# Patient Record
Sex: Male | Born: 1980 | Race: White | Hispanic: No | Marital: Married | Smoking: Never smoker
Health system: Southern US, Community
[De-identification: ages and names within clinical notes are randomized; demographics above are authoritative.]

## PROBLEM LIST (undated history)

## (undated) DIAGNOSIS — R51 Headache: Secondary | ICD-10-CM

## (undated) DIAGNOSIS — R519 Headache, unspecified: Secondary | ICD-10-CM

---

## 2018-08-11 ENCOUNTER — Emergency Department (HOSPITAL_BASED_OUTPATIENT_CLINIC_OR_DEPARTMENT_OTHER): Payer: No Typology Code available for payment source

## 2018-08-11 ENCOUNTER — Emergency Department (HOSPITAL_BASED_OUTPATIENT_CLINIC_OR_DEPARTMENT_OTHER)
Admission: EM | Admit: 2018-08-11 | Discharge: 2018-08-11 | Disposition: A | Discharge status: Home / Self Care | Payer: No Typology Code available for payment source | Attending: Emergency Medicine | Admitting: Emergency Medicine

## 2018-08-11 ENCOUNTER — Other Ambulatory Visit: Payer: Self-pay

## 2018-08-11 ENCOUNTER — Encounter (HOSPITAL_BASED_OUTPATIENT_CLINIC_OR_DEPARTMENT_OTHER): Payer: Self-pay

## 2018-08-11 DIAGNOSIS — J181 Lobar pneumonia, unspecified organism: Secondary | ICD-10-CM

## 2018-08-11 DIAGNOSIS — J189 Pneumonia, unspecified organism: Secondary | ICD-10-CM | POA: Diagnosis not present

## 2018-08-11 DIAGNOSIS — R05 Cough: Secondary | ICD-10-CM | POA: Diagnosis present

## 2018-08-11 LAB — CBC WITH DIFFERENTIAL/PLATELET
BASOS ABS: 0 10*3/uL (ref 0.0–0.1)
Basophils Relative: 0 %
Eosinophils Absolute: 0.2 10*3/uL (ref 0.0–0.7)
Eosinophils Relative: 3 %
HEMATOCRIT: 45.3 % (ref 39.0–52.0)
Hemoglobin: 15.6 g/dL (ref 13.0–17.0)
LYMPHS ABS: 1.2 10*3/uL (ref 0.7–4.0)
Lymphocytes Relative: 16 %
MCH: 30.4 pg (ref 26.0–34.0)
MCHC: 34.4 g/dL (ref 30.0–36.0)
MCV: 88.1 fL (ref 78.0–100.0)
MONO ABS: 0.8 10*3/uL (ref 0.1–1.0)
MONOS PCT: 10 %
NEUTROS ABS: 5.4 10*3/uL (ref 1.7–7.7)
Neutrophils Relative %: 71 %
PLATELETS: 278 10*3/uL (ref 150–400)
RBC: 5.14 MIL/uL (ref 4.22–5.81)
RDW: 13.9 % (ref 11.5–15.5)
WBC: 7.6 10*3/uL (ref 4.0–10.5)

## 2018-08-11 LAB — BASIC METABOLIC PANEL
Anion gap: 13 (ref 5–15)
BUN: 11 mg/dL (ref 6–20)
CALCIUM: 8.6 mg/dL — AB (ref 8.9–10.3)
CO2: 25 mmol/L (ref 22–32)
CREATININE: 1.14 mg/dL (ref 0.61–1.24)
Chloride: 102 mmol/L (ref 98–111)
GFR calc Af Amer: >60 mL/min (ref >60)
GLUCOSE: 105 mg/dL — AB (ref 70–99)
POTASSIUM: 3.4 mmol/L — AB (ref 3.5–5.1)
SODIUM: 140 mmol/L (ref 135–145)

## 2018-08-11 MED ORDER — HYDROCODONE-ACETAMINOPHEN 5-325 MG PO TABS
2.0000 | ORAL_TABLET | Freq: Once | ORAL | Status: AC
  Administered 2018-08-11: 2 via ORAL
  Filled 2018-08-11: qty 2

## 2018-08-11 MED ORDER — KETOROLAC TROMETHAMINE 60 MG/2ML IM SOLN
INTRAMUSCULAR | Status: AC
  Filled 2018-08-11: qty 2

## 2018-08-11 MED ORDER — IPRATROPIUM-ALBUTEROL 0.5-2.5 (3) MG/3ML IN SOLN
3.0000 mL | Freq: Four times a day (QID) | RESPIRATORY_TRACT | Status: DC
  Administered 2018-08-11: 3 mL via RESPIRATORY_TRACT
  Filled 2018-08-11: qty 3

## 2018-08-11 MED ORDER — SODIUM CHLORIDE 0.9 % IV SOLN
1.0000 g | Freq: Once | INTRAVENOUS | Status: AC
  Administered 2018-08-11: 1 g via INTRAVENOUS
  Filled 2018-08-11: qty 10

## 2018-08-11 MED ORDER — SODIUM CHLORIDE 0.9 % IV SOLN
INTRAVENOUS | Status: DC | PRN
  Administered 2018-08-11 (×2): 1000 mL via INTRAVENOUS

## 2018-08-11 MED ORDER — CEFPODOXIME PROXETIL 200 MG PO TABS: 200.0000 mg | ORAL_TABLET | Freq: Two times a day (BID) | ORAL | 0 refills | Status: DC

## 2018-08-11 MED ORDER — KETOROLAC TROMETHAMINE 30 MG/ML IJ SOLN
30.0000 mg | Freq: Once | INTRAMUSCULAR | Status: AC
  Administered 2018-08-11: 30 mg via INTRAVENOUS

## 2018-08-11 MED ORDER — SODIUM CHLORIDE 0.9 % IV BOLUS
1000.0000 mL | Freq: Once | INTRAVENOUS | Status: AC
  Administered 2018-08-11: 1000 mL via INTRAVENOUS

## 2018-08-11 MED ORDER — HYDROCODONE-ACETAMINOPHEN 5-325 MG PO TABS: 1.0000 | ORAL_TABLET | ORAL | 0 refills | Status: DC | PRN

## 2018-08-11 NOTE — ED Notes (Signed)
Pt verbalizes understanding of d/c instructions and denies any further needs at this time. 

## 2018-08-11 NOTE — Discharge Instructions (Signed)
You were evaluated in the emergency department for worsening cough and fever.  You had blood work and chest x-ray and you were found to have a left lower lobe pneumonia.  We are adding a second antibiotic and you should continue to finish out the first antibiotic.  We are also prescribing his pain medicine to take.  You should drink plenty of fluids and continue to take ibuprofen.  Follow-up with your doctor and return if any worsening symptoms.

## 2018-08-11 NOTE — ED Triage Notes (Signed)
C/o cough x 12 days-was seen [facility]-unknown CXR results-was started on zpack and is still taking abx-NAD-steady gait

## 2018-08-11 NOTE — ED Notes (Signed)
Patient transported to X-ray 

## 2018-08-11 NOTE — ED Provider Notes (Signed)
[facility] EMERGENCY DEPARTMENT Provider Note   CSN: [id number] Arrival date & time: 08/11/18  1930     History   Chief Complaint Chief Complaint  Patient presents with  . Cough    HPI Bryan Adams is a 37 y.o. male.  His daughter was sick a couple weeks ago with possible pneumonia and improved on amoxicillin.  He started with respiratory symptoms about 12 days ago and has had cough minimally productive up to the point that he is even vomiting from coughing so hard.  He has had daily headaches with this.  He went to the [place] [facility] where they did a chest x-ray and some blood work and prescribed him Zithromax and an inhaler and some guaifenesin.  He has been using his medications for 2 days with no improvement.  He still feels hot and cold.  Fatigue.  He does not smoke or use drugs and does not have any occupational exposures that he considers significant.  The history is provided by the patient.  Cough  This is a new problem. The current episode started more than 1 week ago. The problem occurs constantly. The problem has not changed since onset.The cough is productive of sputum. Associated symptoms include headaches. Pertinent negatives include no chest pain, no sore throat and no shortness of breath. He has tried cough syrup for the symptoms. The treatment provided no relief. He is not a smoker.    History reviewed. No pertinent past medical history.  There are no active problems to display for this patient.   History reviewed. No pertinent surgical history.      Home Medications    Prior to Admission medications   Not on File    Family History No family history on file.  Social History Social History   Tobacco Use  . Smoking status: Never Smoker  . Smokeless tobacco: Never Used  Substance Use Topics  . Alcohol use: Never    Frequency: Never  . Drug use: Never     Allergies   Patient has no known allergies.   Review of Systems Review of  Systems  Constitutional: Negative for fever.  HENT: Negative for sore throat.   Eyes: Negative for visual disturbance.  Respiratory: Positive for cough. Negative for shortness of breath.   Cardiovascular: Negative for chest pain.  Gastrointestinal: Negative for abdominal pain.  Genitourinary: Negative for dysuria.  Musculoskeletal: Negative for neck pain.  Skin: Negative for rash.  Neurological: Positive for headaches.     Physical Exam Updated Vital Signs BP (!) 166/96 (BP Location: Left Arm)   Pulse 88   Temp 98.6 F (37 C) (Oral)   Resp 20   Ht 5\' 7"  (1.702 m)   Wt 91.2 kg   SpO2 98%   BMI 31.48 kg/m   Physical Exam  Constitutional: He appears well-developed and well-nourished.  HENT:  Head: Normocephalic and atraumatic.  Right Ear: External ear normal.  Left Ear: External ear normal.  Mouth/Throat: Oropharynx is clear and moist.  Eyes: Conjunctivae are normal.  Neck: Neck supple.  Cardiovascular: Normal rate, regular rhythm and normal heart sounds.  No murmur heard. Pulmonary/Chest: Effort normal and breath sounds normal. No respiratory distress.  Abdominal: Soft. There is no tenderness.  Musculoskeletal: He exhibits no edema or deformity.  Neurological: He is alert.  Skin: Skin is warm and dry. Capillary refill takes less than 2 seconds.  Psychiatric: He has a normal mood and affect.  Nursing note and vitals reviewed.  ED Treatments / Results  Labs (all labs ordered are listed, but only abnormal results are displayed) Labs Reviewed  BASIC METABOLIC PANEL - Abnormal; Notable for the following components:      Result Value   Potassium 3.4 (*)    Glucose, Bld 105 (*)    Calcium 8.6 (*)    All other components within normal limits  CBC WITH DIFFERENTIAL/PLATELET    EKG None  Radiology Dg Chest 2 View  Result Date: 08/11/2018 CLINICAL DATA:  Cough EXAM: CHEST - 2 VIEW COMPARISON:  None. FINDINGS: Consolidation in the posterior basal segment of the  left lower lobe. Normal heart size. Lungs otherwise clear. No pneumothorax. No pleural effusion. IMPRESSION: Left lower lobe pneumonia. Followup PA and lateral chest X-ray is recommended in 3-4 weeks following trial of antibiotic therapy to ensure resolution and exclude underlying malignancy. Electronically Signed   By: Jolaine Click M.D.   On: 08/11/2018 20:15    Procedures Procedures (including critical care time)  Medications Ordered in ED Medications  ipratropium-albuterol (DUONEB) 0.5-2.5 (3) MG/3ML nebulizer solution 3 mL (3 mLs Nebulization Given 08/11/18 2003)  ketorolac (TORADOL) 60 MG/2ML injection (has no administration in time range)  0.9 %  sodium chloride infusion ( Intravenous Stopped 08/11/18 2154)  sodium chloride 0.9 % bolus 1,000 mL (0 mLs Intravenous Stopped 08/11/18 2129)  cefTRIAXone (ROCEPHIN) 1 g in sodium chloride 0.9 % 100 mL IVPB (0 g Intravenous Stopped 08/11/18 2154)  ketorolac (TORADOL) 30 MG/ML injection 30 mg (30 mg Intravenous Given 08/11/18 2049)  HYDROcodone-acetaminophen (NORCO/VICODIN) 5-325 MG per tablet 2 tablet (2 tablets Oral Given 08/11/18 2142)     Initial Impression / Assessment and Plan / ED Course  I have reviewed the triage vital signs and the nursing notes.  Pertinent labs & imaging results that were available during my care of the patient were reviewed by me and considered in my medical decision making (see chart for details).   Patient here with cough fevers chills and intermittent headache being treated for probable pneumonia with Zithromax.  He did not have any improvement after 2 days on the medicine.  I have gotten labs and a chest x-ray on him and he does indeed have a left lower lobe pneumonia.  We have given an IV dose of ceftriaxone and okay to add coverage to him with Vantin p.o.  He is asking for something to help with his cough and headache to do so prescribe him some pain medicine in the meantime.  He understands to return if any worsening  symptoms.   Final Clinical Impressions(s) / ED Diagnoses   Final diagnoses:  Community acquired pneumonia of left lower lobe of lung [facility])    ED Discharge Orders         Ordered    cefpodoxime (VANTIN) 200 MG tablet  2 times daily     08/11/18 2123    HYDROcodone-acetaminophen (NORCO/VICODIN) 5-325 MG tablet  Every 4 hours PRN     08/11/18 2123           Terrilee Files, MD 08/11/18 2333

## 2018-08-12 ENCOUNTER — Other Ambulatory Visit: Payer: Self-pay

## 2018-08-12 ENCOUNTER — Observation Stay (HOSPITAL_COMMUNITY)
Admission: EM | Admit: 2018-08-12 | Discharge: 2018-08-14 | Disposition: A | Discharge status: Home / Self Care | Payer: No Typology Code available for payment source | Attending: Family Medicine | Admitting: Family Medicine

## 2018-08-12 ENCOUNTER — Encounter (HOSPITAL_COMMUNITY): Payer: Self-pay | Admitting: *Deleted

## 2018-08-12 DIAGNOSIS — A419 Sepsis, unspecified organism: Secondary | ICD-10-CM | POA: Diagnosis not present

## 2018-08-12 DIAGNOSIS — R509 Fever, unspecified: Secondary | ICD-10-CM | POA: Insufficient documentation

## 2018-08-12 DIAGNOSIS — R05 Cough: Secondary | ICD-10-CM | POA: Diagnosis present

## 2018-08-12 DIAGNOSIS — J181 Lobar pneumonia, unspecified organism: Principal | ICD-10-CM | POA: Diagnosis present

## 2018-08-12 DIAGNOSIS — G9341 Metabolic encephalopathy: Secondary | ICD-10-CM | POA: Diagnosis not present

## 2018-08-12 DIAGNOSIS — G43909 Migraine, unspecified, not intractable, without status migrainosus: Secondary | ICD-10-CM | POA: Diagnosis not present

## 2018-08-12 DIAGNOSIS — R410 Disorientation, unspecified: Secondary | ICD-10-CM | POA: Insufficient documentation

## 2018-08-12 DIAGNOSIS — J189 Pneumonia, unspecified organism: Secondary | ICD-10-CM

## 2018-08-12 DIAGNOSIS — R000 Tachycardia, unspecified: Secondary | ICD-10-CM | POA: Insufficient documentation

## 2018-08-12 HISTORY — DX: Headache, unspecified: R51.9

## 2018-08-12 HISTORY — DX: Headache: R51

## 2018-08-12 LAB — CBC WITH DIFFERENTIAL/PLATELET
BASOS ABS: 0 10*3/uL (ref 0.0–0.1)
Basophils Relative: 0 %
EOS ABS: 0.1 10*3/uL (ref 0.0–0.7)
EOS PCT: 1 %
HCT: 41.4 % (ref 39.0–52.0)
HEMOGLOBIN: 13.7 g/dL (ref 13.0–17.0)
Lymphocytes Relative: 14 %
Lymphs Abs: 1 10*3/uL (ref 0.7–4.0)
MCH: 29.4 pg (ref 26.0–34.0)
MCHC: 33.1 g/dL (ref 30.0–36.0)
MCV: 88.8 fL (ref 78.0–100.0)
Monocytes Absolute: 0.5 10*3/uL (ref 0.1–1.0)
Monocytes Relative: 6 %
NEUTROS PCT: 79 %
Neutro Abs: 5.6 10*3/uL (ref 1.7–7.7)
PLATELETS: 288 10*3/uL (ref 150–400)
RBC: 4.66 MIL/uL (ref 4.22–5.81)
RDW: 14.2 % (ref 11.5–15.5)
WBC: 7.2 10*3/uL (ref 4.0–10.5)

## 2018-08-12 LAB — BASIC METABOLIC PANEL
Anion gap: 11 (ref 5–15)
BUN: 14 mg/dL (ref 6–20)
CALCIUM: 8.5 mg/dL — AB (ref 8.9–10.3)
CO2: 25 mmol/L (ref 22–32)
CREATININE: 1.25 mg/dL — AB (ref 0.61–1.24)
Chloride: 103 mmol/L (ref 98–111)
GFR calc Af Amer: >60 mL/min (ref >60)
GLUCOSE: 96 mg/dL (ref 70–99)
Potassium: 3.6 mmol/L (ref 3.5–5.1)
Sodium: 139 mmol/L (ref 135–145)

## 2018-08-12 LAB — I-STAT CG4 LACTIC ACID, ED: LACTIC ACID, VENOUS: 0.81 mmol/L (ref 0.5–1.9)

## 2018-08-12 MED ORDER — SODIUM CHLORIDE 0.9 % IV SOLN
2.0000 g | Freq: Once | INTRAVENOUS | Status: AC
  Administered 2018-08-12: 2 g via INTRAVENOUS
  Filled 2018-08-12: qty 2

## 2018-08-12 MED ORDER — ACETAMINOPHEN 500 MG PO TABS
1000.0000 mg | ORAL_TABLET | Freq: Once | ORAL | Status: AC
  Administered 2018-08-12: 1000 mg via ORAL
  Filled 2018-08-12: qty 2

## 2018-08-12 MED ORDER — DIPHENHYDRAMINE HCL 50 MG/ML IJ SOLN
25.0000 mg | Freq: Once | INTRAMUSCULAR | Status: AC
  Administered 2018-08-12: 25 mg via INTRAVENOUS
  Filled 2018-08-12: qty 1

## 2018-08-12 MED ORDER — VANCOMYCIN HCL IN DEXTROSE 1-5 GM/200ML-% IV SOLN: 1000.0000 mg | Freq: Once | INTRAVENOUS | Status: DC

## 2018-08-12 MED ORDER — SODIUM CHLORIDE 0.9 % IV BOLUS: 1000.0000 mL | Freq: Once | INTRAVENOUS | Status: DC

## 2018-08-12 MED ORDER — SODIUM CHLORIDE 0.9 % IV BOLUS
1000.0000 mL | Freq: Once | INTRAVENOUS | Status: AC
  Administered 2018-08-12: 1000 mL via INTRAVENOUS

## 2018-08-12 MED ORDER — KETOROLAC TROMETHAMINE 30 MG/ML IJ SOLN
30.0000 mg | Freq: Once | INTRAMUSCULAR | Status: AC
  Administered 2018-08-12: 30 mg via INTRAVENOUS
  Filled 2018-08-12: qty 1

## 2018-08-12 MED ORDER — METOCLOPRAMIDE HCL 5 MG/ML IJ SOLN
10.0000 mg | Freq: Once | INTRAMUSCULAR | Status: AC
  Administered 2018-08-12: 10 mg via INTRAVENOUS
  Filled 2018-08-12: qty 2

## 2018-08-12 MED ORDER — IPRATROPIUM-ALBUTEROL 0.5-2.5 (3) MG/3ML IN SOLN
3.0000 mL | Freq: Once | RESPIRATORY_TRACT | Status: AC
  Administered 2018-08-12: 3 mL via RESPIRATORY_TRACT
  Filled 2018-08-12: qty 3

## 2018-08-12 MED ORDER — VANCOMYCIN HCL 10 G IV SOLR
2000.0000 mg | Freq: Once | INTRAVENOUS | Status: AC
  Administered 2018-08-12: 2000 mg via INTRAVENOUS
  Filled 2018-08-12: qty 2000

## 2018-08-12 MED ORDER — SODIUM CHLORIDE 0.9 % IV BOLUS
2000.0000 mL | Freq: Once | INTRAVENOUS | Status: AC
  Administered 2018-08-13: 2000 mL via INTRAVENOUS

## 2018-08-12 NOTE — H&P (Signed)
History and Physical    Diana EvesMichael A Nester ZOX:[id number] DOB: 03/23/1981 DOA: 08/12/2018  Referring MD/NP/PA:   PCP: Patient, No Pcp Per   Patient coming from:  The patient is coming from home.  At baseline, pt is independent for most of ADL.   Chief Complaint: cough, SOB, fever and chills, mild confusion  HPI: Diana EvesMichael A Madril is a 37 y.o. male with medical history significant of occasional headaches, who presents with cough, fever, chills, shortness of breath and mild confusion.  Patient states that he has been having cough, mild shortness of breath, fever and chills for more than 2 weeks.  He also has mild chest pain which is induced by coughing.  Mostly patient just has dry cough. Pt [facility].  Has not had any improvement despite taking azithro.  [facility] and had CXR that showed left lower lobe pneumonia.  he was given fluids and rocephin injection. He was discharged on Vantin. Per his wife, pt continues to have cough and mild shortness of breath.  He becomes lethargic and mildly confused. When saw pt in  ED, he is lethargic, but oriented x3.  He answers all questions appropriately.  Patient states that he has headache behind both eyes, no neck rigidity. No recent long distant traveling.  No tenderness in the calf areas.  Patient had loose stool yesterday, which has resolved.  Currently patient does not have nausea, vomiting, diarrhea or abdominal pain.  Denies symptoms of UTI.  No unilateral weakness.  Patient is strongly denies any fall or head injury recently.  Patient states that he is getting testosterone injection due to lower testosterone level, last dose was on Monday.   ED Course: pt was found to have WBC 7.2, lactic acid is 0.81, creatinine 1.25, GFR> 60, temperature 101.9, tachycardia, tachypnea, oxygen saturation 92 to 97% on room air.  Chest x-ray showed left lower lobe infiltration.  Patient is placed on telemetry  bed for observation.  Review of Systems:   General: has fevers, chills, no body weight gain, has fatigue HEENT: no blurry vision, hearing changes or sore throat Respiratory: has dyspnea, coughing, no wheezing CV: has chest pain, no palpitations GI: no nausea, vomiting, abdominal pain, diarrhea, constipation GU: no dysuria, burning on urination, increased urinary frequency, hematuria  Ext: no leg edema Neuro: no unilateral weakness, numbness, or tingling, no vision change or hearing loss. Has confusion. Skin: no rash, no skin tear. MSK: No muscle spasm, no deformity, no limitation of range of movement in spin Heme: No easy bruising.  Travel history: No recent long distant travel.  Allergy: No Known Allergies  Past Medical History:  Diagnosis Date  . Headache     History reviewed. No pertinent surgical history.  Social History:  reports that he has never smoked. He has never used smokeless tobacco. He reports that he does not drink alcohol or use drugs.  Family History:  Family History  Problem Relation Age of Onset  . Diabetes Mellitus II Father      Prior to Admission medications   Medication Sig Start Date End Date Taking? Authorizing Provider  cefpodoxime (VANTIN) 200 MG tablet Take 1 tablet (200 mg total) by mouth 2 (two) times daily. 08/11/18  Yes Terrilee FilesButler, Henrry C, MD  HYDROcodone-acetaminophen (NORCO/VICODIN) 5-325 MG tablet Take 1-2 tablets by mouth every 4 (four) hours as needed for severe pain. 08/11/18  Yes Terrilee FilesButler, Duron C, MD    Physical  Exam: Vitals:   08/12/18 2230 08/12/18 2245 08/12/18 2325 08/12/18 2342  BP: 134/75 134/67  127/68  Pulse: (!) 102 93  (!) 109  Resp: 19 19  (!) 22  Temp:   (!) 100.4 F (38 C)   TempSrc:      SpO2: 96% 97%  97%   General: Not in acute distress HEENT:       Eyes: PERRL, EOMI, no scleral icterus.       ENT: No discharge from the ears and nose, no pharynx injection, no tonsillar enlargement.        Neck: No JVD, no  bruit, no mass felt. Heme: No neck lymph node enlargement. Cardiac: S1/S2, RRR, No murmurs, No gallops or rubs. Respiratory: No rales, wheezing, rhonchi or rubs. GI: Soft, nondistended, nontender, no rebound pain, no organomegaly, BS present. GU: No hematuria Ext: No pitting leg edema bilaterally. 2+DP/PT pulse bilaterally. Musculoskeletal: No joint deformities, No joint redness or warmth, no limitation of ROM in spin. Skin: No rashes.  Neuro: lethargic, but oriented X3, cranial nerves II-XII grossly intact, moves all extremities normally. Neck is supple. Psych: Patient is not psychotic, no suicidal or hemocidal ideation.  Labs on Admission: I have personally reviewed following labs and imaging studies  CBC: Recent Labs  Lab 08/11/18 2002 08/12/18 2054  WBC 7.6 7.2  NEUTROABS 5.4 5.6  HGB 15.6 13.7  HCT 45.3 41.4  MCV 88.1 88.8  PLT 278 288   Basic Metabolic Panel: Recent Labs  Lab 08/11/18 2002 08/12/18 2054  NA 140 139  K 3.4* 3.6  CL 102 103  CO2 25 25  GLUCOSE 105* 96  BUN 11 14  CREATININE 1.14 1.25*  CALCIUM 8.6* 8.5*   GFR: Estimated Creatinine Clearance: 87.1 mL/min (A) (by C-G formula based on SCr of 1.25 mg/dL (H)). Liver Function Tests: No results for input(s): AST, ALT, ALKPHOS, BILITOT, PROT, ALBUMIN in the last 168 hours. No results for input(s): LIPASE, AMYLASE in the last 168 hours. No results for input(s): AMMONIA in the last 168 hours. Coagulation Profile: No results for input(s): INR, PROTIME in the last 168 hours. Cardiac Enzymes: No results for input(s): CKTOTAL, CKMB, CKMBINDEX, TROPONINI in the last 168 hours. BNP (last 3 results) No results for input(s): PROBNP in the last 8760 hours. HbA1C: No results for input(s): HGBA1C in the last 72 hours. CBG: No results for input(s): GLUCAP in the last 168 hours. Lipid Profile: No results for input(s): CHOL, HDL, LDLCALC, TRIG, CHOLHDL, LDLDIRECT in the last 72 hours. Thyroid Function Tests: No  results for input(s): TSH, T4TOTAL, FREET4, T3FREE, THYROIDAB in the last 72 hours. Anemia Panel: No results for input(s): VITAMINB12, FOLATE, FERRITIN, TIBC, IRON, RETICCTPCT in the last 72 hours. Urine analysis: No results found for: COLORURINE, APPEARANCEUR, LABSPEC, PHURINE, GLUCOSEU, HGBUR, BILIRUBINUR, KETONESUR, PROTEINUR, UROBILINOGEN, NITRITE, LEUKOCYTESUR Sepsis Labs: @LABRCNTIP (procalcitonin:4,lacticidven:4) )No results found for this or any previous visit (from the past 240 hour(s)).   Radiological Exams on Admission: Dg Chest 2 View  Result Date: 08/11/2018 CLINICAL DATA:  Cough EXAM: CHEST - 2 VIEW COMPARISON:  None. FINDINGS: Consolidation in the posterior basal segment of the left lower lobe. Normal heart size. Lungs otherwise clear. No pneumothorax. No pleural effusion. IMPRESSION: Left lower lobe pneumonia. Followup PA and lateral chest X-ray is recommended in 3-4 weeks following trial of antibiotic therapy to ensure resolution and exclude underlying malignancy. Electronically Signed   By: Jolaine Click M.D.   On: 08/11/2018 20:15     EKG: Independently  reviewed.  Sinus rhythm, QTC 427, nonspecific T wave change.   Assessment/Plan Principal Problem:   Lobar pneumonia (HCC) Active Problems:   Sepsis (HCC)   Acute metabolic encephalopathy   Sepsis due to lobar pneumonia [facility]): Patient meets criteria for sepsis with fever, tachycardia and tachypnea.  Lactic acid is normal.  Hemodynamically stable currently.  Patient does not have leukocytosis, indicating possible viral pneumonia.  Patient has confusion and headache, but neck is supple, no rigidity, low suspicions of meningitis.  Patient states that he has history of occasional headache.  He takes Excedrin for headache at home. Pt failed outpatient antibiotics treatment (azithromycin and Vantin), will broaden the antibiotic coverage.   - Will place on telemetry bed for obs. - IV Vancomycin and cefepime, doxycycline -  Mucinex for cough  - Atrovent nebs and Xopenex Neb prn for SOB - Urine legionella and S. pneumococcal antigen - Follow up blood culture x2, sputum culture and respiratory virus panel - will get Procalcitonin and trend lactic acid level per sepsis protocol - IVF: 3L of NS bolus in ED, followed by 125 mL per hour of NS   Acute metabolic encephalopathy: Likely due to sepsis.  No meningeal sign.  Strongly denies recent fall or head injury.  -Frequent neuro check -As needed Excedrin for headache -if no improvement, may need to consider to get CT head pneumonia    DVT ppx: SQ Lovenox Code Status: Full code Family Communication:     Yes, patient's wife at bed side Disposition Plan:  Anticipate discharge back to previous home environment Consults called:  none Admission status: Obs / tele     Date of Service 08/13/2018    Lorretta Harp [facility] Pager [phone]  If 7PM-7AM, please contact night-coverage [email or web address] Password [email or web address] 08/13/2018, 1:18 AM

## 2018-08-12 NOTE — Progress Notes (Signed)
A consult was received from an ED physician for Vancomycin & Cefepime per pharmacy dosing.  The patient's profile has been reviewed for ht/wt/allergies/indication/available labs.   A one time order has been placed for Vancomycin 2gm & Cefepime 2gm IV.  Further antibiotics/pharmacy consults should be ordered by admitting physician if indicated.                       Thank you, Elson ClanLilliston, Jamika Sadek Michelle 08/12/2018  8:47 PM

## 2018-08-12 NOTE — ED Provider Notes (Addendum)
[facility]-EMERGENCY DEPT Provider Note   CSN: [id number] Arrival date & time: 08/12/18  1927     History   Chief Complaint Chief Complaint  Patient presents with  . Pneumonia    HPI Bryan Adams is a 37 y.o. male.  Patient presents to the ED with a chief complaint of pneumonia.  Patient reports feeling sick for the past 2 weeks.  Was [facility] [place].  Has not had any improvement despite taking azithro.  [facility] and had CXR that showed left lower lobe pneumonia.  Was given fluids and rocephin and was discharged.  Was advised to come back to the ED if symptoms worsened.  Patient wife states that when she got home today he was confused and lethargic.  She brought him to the ED for re-evaluation.  Patient complains of severe cough with associated post-tussive emesis.  He reports associated headache, which he believes is from coughing so much.    The history is provided by the patient. No language interpreter was used.    History reviewed. No pertinent past medical history.  There are no active problems to display for this patient.   History reviewed. No pertinent surgical history.      Home Medications    Prior to Admission medications   Medication Sig Start Date End Date Taking? Authorizing Provider  cefpodoxime (VANTIN) 200 MG tablet Take 1 tablet (200 mg total) by mouth 2 (two) times daily. 08/11/18   Terrilee FilesButler, Calix C, MD  HYDROcodone-acetaminophen (NORCO/VICODIN) 5-325 MG tablet Take 1-2 tablets by mouth every 4 (four) hours as needed for severe pain. 08/11/18   Terrilee FilesButler, Anthonny C, MD    Family History No family history on file.  Social History Social History   Tobacco Use  . Smoking status: Never Smoker  . Smokeless tobacco: Never Used  Substance Use Topics  . Alcohol use: Never    Frequency: Never  . Drug use: Never     Allergies   Patient has no known allergies.   Review of  Systems Review of Systems  All other systems reviewed and are negative.    Physical Exam Updated Vital Signs BP (!) 150/79 (BP Location: Right Arm)   Pulse (!) 108   Temp (!) 101.9 F (38.8 C) (Oral)   Resp 17   SpO2 97%   Physical Exam  Constitutional: He is oriented to person, place, and time. He appears well-developed and well-nourished.  HENT:  Head: Normocephalic and atraumatic.  Eyes: Pupils are equal, round, and reactive to light. Conjunctivae and EOM are normal. Right eye exhibits no discharge. Left eye exhibits no discharge. No scleral icterus.  Neck: Normal range of motion. Neck supple. No JVD present.  Cardiovascular: Regular rhythm and normal heart sounds. Exam reveals no gallop and no friction rub.  No murmur heard. tachycardic  Pulmonary/Chest: Effort normal. No respiratory distress. He has no wheezes. He has no rales. He exhibits no tenderness.  Crackles in left lower lobe  Abdominal: Soft. He exhibits no distension and no mass. There is no tenderness. There is no rebound and no guarding.  Musculoskeletal: Normal range of motion. He exhibits no edema or tenderness.  Neurological: He is alert and oriented to person, place, and time.  Skin: Skin is warm and dry.  Psychiatric: He has a normal mood and affect. His behavior is normal. Judgment and thought content normal.  Nursing note and vitals reviewed.  ED Treatments / Results  Labs (all labs ordered are listed, but only abnormal results are displayed) Labs Reviewed  CULTURE, BLOOD (ROUTINE X 2)  CULTURE, BLOOD (ROUTINE X 2)  CBC WITH DIFFERENTIAL/PLATELET  BASIC METABOLIC PANEL  I-STAT CG4 LACTIC ACID, ED  I-STAT CG4 LACTIC ACID, ED    EKG EKG Interpretation  Date/Time:  Thursday August 12 2018 21:00:21 EDT Ventricular Rate:  108 PR Interval:    QRS Duration: 90 QT Interval:  318 QTC Calculation: 427 R Axis:   89 Text Interpretation:  Sinus tachycardia Consider left ventricular hypertrophy  No old tracing to compare Confirmed by Linwood Dibbles [phone]) on 08/12/2018 9:03:58 PM   Radiology Dg Chest 2 View  Result Date: 08/11/2018 CLINICAL DATA:  Cough EXAM: CHEST - 2 VIEW COMPARISON:  None. FINDINGS: Consolidation in the posterior basal segment of the left lower lobe. Normal heart size. Lungs otherwise clear. No pneumothorax. No pleural effusion. IMPRESSION: Left lower lobe pneumonia. Followup PA and lateral chest X-ray is recommended in 3-4 weeks following trial of antibiotic therapy to ensure resolution and exclude underlying malignancy. Electronically Signed   By: Jolaine Click M.D.   On: 08/11/2018 20:15    Procedures Procedures (including critical care time)  Medications Ordered in ED Medications  ceFEPIme (MAXIPIME) 2 g in sodium chloride 0.9 % 100 mL IVPB (2 g Intravenous New Bag/Given 08/12/18 2320)  sodium chloride 0.9 % bolus 2,000 mL (has no administration in time range)  sodium chloride 0.9 % bolus 1,000 mL (0 mLs Intravenous Stopped 08/12/18 2313)  ipratropium-albuterol (DUONEB) 0.5-2.5 (3) MG/3ML nebulizer solution 3 mL (3 mLs Nebulization Given 08/12/18 2051)  vancomycin (VANCOCIN) 2,000 mg in sodium chloride 0.9 % 500 mL IVPB (0 mg Intravenous Stopped 08/12/18 2313)  ketorolac (TORADOL) 30 MG/ML injection 30 mg (30 mg Intravenous Given 08/12/18 2121)  metoCLOPramide (REGLAN) injection 10 mg (10 mg Intravenous Given 08/12/18 2121)  diphenhydrAMINE (BENADRYL) injection 25 mg (25 mg Intravenous Given 08/12/18 2121)  acetaminophen (TYLENOL) tablet 1,000 mg (1,000 mg Oral Given 08/12/18 2320)     Initial Impression / Assessment and Plan / ED Course  I have reviewed the triage vital signs and the nursing notes.  Pertinent labs & imaging results that were available during my care of the patient were reviewed by me and considered in my medical decision making (see chart for details).     Patient with diagnosed pneumonia.  Has been sick for the past 2 weeks.  Started antibiotics  3 days ago.  Febrile to 101.9, tachycardic to 115, CXR from yesterday shows left lower lobe pneumonia.  Patient meets sepsis criteria with SIRS plus source, will activate code sepsis.   He is not hypotensive.  He does not have an elevated lactate.  Will hold off on weight base fluids, but will give 1 liter.  Will also give headache cocktail for bad headache.    Consider admission due to failing outpatient treatment.    Patient O2 sat is 91-93 in bed on RA.  O2 sat drops to upper 80s while ambulating.  States feels very fatigued and lightheaded while ambulating.    Will consult hospitalist for admission.  Appreciate Dr. Clyde Lundborg for bringing patient into the hospital.  Final Clinical Impressions(s) / ED Diagnoses   Final diagnoses:  Community acquired pneumonia of left lower lobe of lung [facility])    ED Discharge Orders    None       Roxy Horseman, PA-C 08/12/18 2338    Roxy Horseman, PA-C  08/13/18 0342    Molpus, Jonny Ruiz, MD 08/13/18 4098

## 2018-08-12 NOTE — ED Triage Notes (Signed)
Wife reports pt was seen at med center last night and was dx with PNA and was sent home with PO abx.  He is not getting better and is feeling worse.  Coughing more.  He has pain in his back and chest when coughing with fever.  He reports he has no energy.

## 2018-08-12 NOTE — ED Notes (Signed)
Pt ambulate from the room at 93% walk around the ED department and back to room 86%

## 2018-08-13 ENCOUNTER — Other Ambulatory Visit: Payer: Self-pay

## 2018-08-13 ENCOUNTER — Encounter (HOSPITAL_COMMUNITY): Payer: Self-pay | Admitting: Internal Medicine

## 2018-08-13 DIAGNOSIS — J181 Lobar pneumonia, unspecified organism: Secondary | ICD-10-CM | POA: Diagnosis not present

## 2018-08-13 DIAGNOSIS — A419 Sepsis, unspecified organism: Secondary | ICD-10-CM | POA: Diagnosis not present

## 2018-08-13 DIAGNOSIS — G9341 Metabolic encephalopathy: Secondary | ICD-10-CM | POA: Diagnosis not present

## 2018-08-13 LAB — RESPIRATORY PANEL BY PCR
Adenovirus: DETECTED — AB
BORDETELLA PERTUSSIS-RVPCR: NOT DETECTED
CHLAMYDOPHILA PNEUMONIAE-RVPPCR: NOT DETECTED
CORONAVIRUS 229E-RVPPCR: NOT DETECTED
CORONAVIRUS HKU1-RVPPCR: NOT DETECTED
Coronavirus NL63: NOT DETECTED
Coronavirus OC43: NOT DETECTED
Influenza A: NOT DETECTED
Influenza B: NOT DETECTED
MYCOPLASMA PNEUMONIAE-RVPPCR: NOT DETECTED
Metapneumovirus: NOT DETECTED
Parainfluenza Virus 1: NOT DETECTED
Parainfluenza Virus 2: NOT DETECTED
Parainfluenza Virus 3: NOT DETECTED
Parainfluenza Virus 4: NOT DETECTED
RHINOVIRUS / ENTEROVIRUS - RVPPCR: NOT DETECTED
Respiratory Syncytial Virus: NOT DETECTED

## 2018-08-13 LAB — STREP PNEUMONIAE URINARY ANTIGEN: STREP PNEUMO URINARY ANTIGEN: NEGATIVE

## 2018-08-13 LAB — LACTIC ACID, PLASMA: LACTIC ACID, VENOUS: 0.8 mmol/L (ref 0.5–1.9)

## 2018-08-13 LAB — HIV ANTIBODY (ROUTINE TESTING W REFLEX): HIV SCREEN 4TH GENERATION: NONREACTIVE

## 2018-08-13 LAB — MRSA PCR SCREENING: MRSA by PCR: NEGATIVE

## 2018-08-13 LAB — PROCALCITONIN

## 2018-08-13 MED ORDER — KETOROLAC TROMETHAMINE 15 MG/ML IJ SOLN
15.0000 mg | Freq: Once | INTRAMUSCULAR | Status: AC
  Administered 2018-08-13: 15 mg via INTRAVENOUS
  Filled 2018-08-13: qty 1

## 2018-08-13 MED ORDER — IPRATROPIUM-ALBUTEROL 0.5-2.5 (3) MG/3ML IN SOLN
3.0000 mL | Freq: Four times a day (QID) | RESPIRATORY_TRACT | Status: DC
  Administered 2018-08-13: 3 mL via RESPIRATORY_TRACT
  Filled 2018-08-13: qty 3

## 2018-08-13 MED ORDER — ENOXAPARIN SODIUM 40 MG/0.4ML ~~LOC~~ SOLN
40.0000 mg | Freq: Every day | SUBCUTANEOUS | Status: DC
  Administered 2018-08-13: 40 mg via SUBCUTANEOUS
  Filled 2018-08-13: qty 0.4

## 2018-08-13 MED ORDER — SUMATRIPTAN SUCCINATE 50 MG PO TABS
50.0000 mg | ORAL_TABLET | Freq: Two times a day (BID) | ORAL | Status: DC | PRN
  Administered 2018-08-13 – 2018-08-14 (×2): 50 mg via ORAL
  Filled 2018-08-13 (×3): qty 1

## 2018-08-13 MED ORDER — IPRATROPIUM BROMIDE 0.02 % IN SOLN: 0.5000 mg | Freq: Two times a day (BID) | RESPIRATORY_TRACT | Status: DC

## 2018-08-13 MED ORDER — METHYLPREDNISOLONE SODIUM SUCC 125 MG IJ SOLR
60.0000 mg | Freq: Two times a day (BID) | INTRAMUSCULAR | Status: DC
  Administered 2018-08-13 – 2018-08-14 (×3): 60 mg via INTRAVENOUS
  Filled 2018-08-13 (×3): qty 2

## 2018-08-13 MED ORDER — ACETAMINOPHEN 325 MG PO TABS: 650.0000 mg | ORAL_TABLET | Freq: Four times a day (QID) | ORAL | Status: DC | PRN

## 2018-08-13 MED ORDER — DIPHENHYDRAMINE HCL 50 MG/ML IJ SOLN
25.0000 mg | Freq: Once | INTRAMUSCULAR | Status: AC
  Administered 2018-08-13: 25 mg via INTRAVENOUS
  Filled 2018-08-13: qty 1

## 2018-08-13 MED ORDER — SODIUM CHLORIDE 3 % IN NEBU

## 2018-08-13 MED ORDER — ZOLPIDEM TARTRATE 5 MG PO TABS: 5.0000 mg | ORAL_TABLET | Freq: Every evening | ORAL | Status: DC | PRN

## 2018-08-13 MED ORDER — ASPIRIN-ACETAMINOPHEN-CAFFEINE 250-250-65 MG PO TABS
1.0000 | ORAL_TABLET | Freq: Three times a day (TID) | ORAL | Status: DC | PRN
  Administered 2018-08-13: 1 via ORAL
  Filled 2018-08-13 (×3): qty 1

## 2018-08-13 MED ORDER — SODIUM CHLORIDE 0.9 % IV SOLN
INTRAVENOUS | Status: DC
  Administered 2018-08-13 – 2018-08-14 (×4): via INTRAVENOUS

## 2018-08-13 MED ORDER — DM-GUAIFENESIN ER 30-600 MG PO TB12
2.0000 | ORAL_TABLET | Freq: Two times a day (BID) | ORAL | Status: DC
  Administered 2018-08-13 – 2018-08-14 (×3): 2 via ORAL
  Filled 2018-08-13 (×3): qty 2

## 2018-08-13 MED ORDER — DOXYCYCLINE HYCLATE 100 MG PO TABS
100.0000 mg | ORAL_TABLET | Freq: Two times a day (BID) | ORAL | Status: DC
  Administered 2018-08-13 – 2018-08-14 (×4): 100 mg via ORAL
  Filled 2018-08-13 (×4): qty 1

## 2018-08-13 MED ORDER — LEVALBUTEROL HCL 1.25 MG/0.5ML IN NEBU: 1.2500 mg | INHALATION_SOLUTION | Freq: Two times a day (BID) | RESPIRATORY_TRACT | Status: DC

## 2018-08-13 MED ORDER — LEVALBUTEROL HCL 0.63 MG/3ML IN NEBU: 0.6300 mg | INHALATION_SOLUTION | Freq: Four times a day (QID) | RESPIRATORY_TRACT | Status: DC | PRN

## 2018-08-13 MED ORDER — HYDROCODONE-ACETAMINOPHEN 5-325 MG PO TABS
1.0000 | ORAL_TABLET | ORAL | Status: DC | PRN
  Administered 2018-08-13 (×3): 1 via ORAL
  Filled 2018-08-13 (×4): qty 1

## 2018-08-13 MED ORDER — IPRATROPIUM-ALBUTEROL 0.5-2.5 (3) MG/3ML IN SOLN
3.0000 mL | Freq: Three times a day (TID) | RESPIRATORY_TRACT | Status: DC
  Administered 2018-08-13 – 2018-08-14 (×3): 3 mL via RESPIRATORY_TRACT
  Filled 2018-08-13 (×4): qty 3

## 2018-08-13 MED ORDER — IPRATROPIUM BROMIDE 0.02 % IN SOLN: 0.5000 mg | Freq: Four times a day (QID) | RESPIRATORY_TRACT | Status: DC

## 2018-08-13 MED ORDER — ONDANSETRON HCL 4 MG/2ML IJ SOLN
4.0000 mg | Freq: Three times a day (TID) | INTRAMUSCULAR | Status: DC | PRN
  Administered 2018-08-13: 4 mg via INTRAVENOUS
  Filled 2018-08-13 (×2): qty 2

## 2018-08-13 MED ORDER — DM-GUAIFENESIN ER 30-600 MG PO TB12
1.0000 | ORAL_TABLET | Freq: Two times a day (BID) | ORAL | Status: DC
  Administered 2018-08-13: 1 via ORAL
  Filled 2018-08-13: qty 1

## 2018-08-13 MED ORDER — LEVALBUTEROL HCL 1.25 MG/0.5ML IN NEBU: 1.2500 mg | INHALATION_SOLUTION | Freq: Four times a day (QID) | RESPIRATORY_TRACT | Status: DC

## 2018-08-13 MED ORDER — SODIUM CHLORIDE 0.9 % IV SOLN
1.0000 g | Freq: Three times a day (TID) | INTRAVENOUS | Status: DC
  Administered 2018-08-13 – 2018-08-14 (×4): 1 g via INTRAVENOUS
  Filled 2018-08-13 (×6): qty 1

## 2018-08-13 MED ORDER — VANCOMYCIN HCL 10 G IV SOLR
1500.0000 mg | INTRAVENOUS | Status: DC
  Filled 2018-08-13: qty 1500

## 2018-08-13 MED ORDER — METOCLOPRAMIDE HCL 5 MG/ML IJ SOLN
10.0000 mg | Freq: Once | INTRAMUSCULAR | Status: AC
  Administered 2018-08-13: 10 mg via INTRAVENOUS
  Filled 2018-08-13: qty 2

## 2018-08-13 NOTE — Progress Notes (Signed)
Pharmacy Antibiotic Note  Diana EvesMichael A Situ is a 37 y.o. male admitted on 08/12/2018 with pneumonia.  Pharmacy has been consulted for Vancomycin, cefepime  dosing.  Plan: Cefepime 2gm iv x1, then 1gm iv q8hr  Vancomycin 2gm iv x1, then 1500mg  iv q24hr  Goal AUC = 400 - 500 for all indications, except meningitis (goal AUC > 500 and Cmin 15-20 mcg/mL)     Temp (24hrs), Avg:100.1 F (37.8 C), Min:98.9 F (37.2 C), Max:101.9 F (38.8 C)  Recent Labs  Lab 08/11/18 2002 08/12/18 2054 08/12/18 2057 08/13/18 0210  WBC 7.6 7.2  --   --   CREATININE 1.14 1.25*  --   --   LATICACIDVEN  --   --  0.81 0.8    Estimated Creatinine Clearance: 87.1 mL/min (A) (by C-G formula based on SCr of 1.25 mg/dL (H)).    No Known Allergies  Antimicrobials this admission: Vancomycin 08/12/2018 >> Cefepime 08/12/2018 >>  Dose adjustments this admission: -  Microbiology results: -  Thank you for allowing pharmacy to be a part of this patient's care.  Aleene DavidsonGrimsley Jr, Daivik Overley Crowford 08/13/2018 3:40 AM

## 2018-08-13 NOTE — Progress Notes (Signed)
PROGRESS NOTE  Bryan Adams ZOX:[id number] DOB: 07-14-1981 DOA: 08/12/2018 PCP: Patient, No Pcp Per  HPI/Recap of past 24 hours: Bryan Adams is a 37 y.o. male with medical history significant of migraine headaches, who presents with cough, fever, chills, shortness of breath and mild confusion.  Admitted for left lower lobe community-acquired pneumonia.  08/13/2018: Patient seen and examined at his bedside.  He has been feeling nauseous all night requiring IV Zofran.  Unable to tolerate p.o.  Change his diet to clear liquid and encouraged him to take in at least fluid avoid dehydration.  Diffused wheezing and rales on exam.  Started on IV Solu-Medrol.  Also complains of severe migraine with sensitivity to light and noises.  Started on sumatriptan as needed.    Assessment/Plan: Principal Problem:   Lobar pneumonia (HCC) Active Problems:   Sepsis (HCC)   Acute metabolic encephalopathy  Sepsis due to lobar pneumonia [facility]): Patient meets criteria for sepsis with fever, tachycardia and tachypnea.   Continue to treat pneumonia with IV antibiotics cefepime DC IV vancomycin due to negative MRSA Patient currently cannot tolerate p.o. Continue IV fluid to avoid dehydration Continue to monitor fever curve Continue to monitor WBC Obtain CBC in the morning To note patient failed outpatient antibiotics treatment with azithromycin and Vantin. Continue Mucinex Continue duo nebs every 6 hours and every 2 hours as needed Start hypersaline nebs 3 times daily for pulmonary toilet Start IV Solu-Medrol for wheezes 60 mg twice daily O2 supplementation as needed to maintain O2 saturation greater than 92%  Severe migraine Patient reports he has had intermittent severe headache for 2 weeks associated with sensitivity to light and sounds Sumatriptan started as needed Also given headache cocktail with IV Reglan, IV Toradol and IV Benadryl with minimal help  Sinus tachycardia Suspect  related to service Continue IV antibiotics Continue to monitor vital signs  Risks: High risk for decompensation due to left lobe community-acquired pneumonia that has failed outpatient antibiotics treatment with increased risk for respiratory failure.  The patient will need another day of IV antibiotics as he cannot tolerate p.o.  Patient has been nauseous requiring IV Zofran frequently.   Code Status: Full code  Family Communication: Wife at bedside  Disposition Plan: Home possibly tomorrow 08/14/2018   Consultants:  None  Procedures:  Cefepime  Antimicrobials:  Cefepime  DVT prophylaxis: Subcu Lovenox daily   Objective: Vitals:   08/12/18 2342 08/13/18 0130 08/13/18 0803 08/13/18 1322  BP: 127/68 129/77  139/90  Pulse: (!) 109 93  (!) 103  Resp: (!) 22 20  18   Temp:  98.9 F (37.2 C)  99.2 F (37.3 C)  TempSrc:      SpO2: 97% 96% 95% 94%    Intake/Output Summary (Last 24 hours) at 08/13/2018 1749 Last data filed at 08/13/2018 1625 Gross per 24 hour  Intake 3270.93 ml  Output 2701 ml  Net 569.93 ml   There were no vitals filed for this visit.  Exam:  . General: 37 y.o. year-old male well developed well nourished.  Appears uncomfortable due to severe migraine.  Alert and oriented x3. . Cardiovascular: Regular rate and rhythm with no rubs or gallops.  No thyromegaly or JVD noted.   Marland Kitchen Respiratory: Diffuse wheezes and rales bilaterally.  Good inspiratory effort. . Abdomen: Soft nontender nondistended with normal bowel sounds x4 quadrants. . Musculoskeletal: No lower extremity edema. 2/4 pulses in all 4 extremities. . Skin: No ulcerative lesions noted or rashes, . Psychiatry: Mood is  appropriate for condition and setting   Data Reviewed: CBC: Recent Labs  Lab 08/11/18 2002 08/12/18 2054  WBC 7.6 7.2  NEUTROABS 5.4 5.6  HGB 15.6 13.7  HCT 45.3 41.4  MCV 88.1 88.8  PLT 278 288   Basic Metabolic Panel: Recent Labs  Lab 08/11/18 2002 08/12/18 2054    NA 140 139  K 3.4* 3.6  CL 102 103  CO2 25 25  GLUCOSE 105* 96  BUN 11 14  CREATININE 1.14 1.25*  CALCIUM 8.6* 8.5*   GFR: Estimated Creatinine Clearance: 87.1 mL/min (A) (by C-G formula based on SCr of 1.25 mg/dL (H)). Liver Function Tests: No results for input(s): AST, ALT, ALKPHOS, BILITOT, PROT, ALBUMIN in the last 168 hours. No results for input(s): LIPASE, AMYLASE in the last 168 hours. No results for input(s): AMMONIA in the last 168 hours. Coagulation Profile: No results for input(s): INR, PROTIME in the last 168 hours. Cardiac Enzymes: No results for input(s): CKTOTAL, CKMB, CKMBINDEX, TROPONINI in the last 168 hours. BNP (last 3 results) No results for input(s): PROBNP in the last 8760 hours. HbA1C: No results for input(s): HGBA1C in the last 72 hours. CBG: No results for input(s): GLUCAP in the last 168 hours. Lipid Profile: No results for input(s): CHOL, HDL, LDLCALC, TRIG, CHOLHDL, LDLDIRECT in the last 72 hours. Thyroid Function Tests: No results for input(s): TSH, T4TOTAL, FREET4, T3FREE, THYROIDAB in the last 72 hours. Anemia Panel: No results for input(s): VITAMINB12, FOLATE, FERRITIN, TIBC, IRON, RETICCTPCT in the last 72 hours. Urine analysis: No results found for: COLORURINE, APPEARANCEUR, LABSPEC, PHURINE, GLUCOSEU, HGBUR, BILIRUBINUR, KETONESUR, PROTEINUR, UROBILINOGEN, NITRITE, LEUKOCYTESUR Sepsis Labs: @LABRCNTIP (procalcitonin:4,lacticidven:4)  ) Recent Results (from the past 240 hour(s))  Blood Culture (routine x 2)     Status: None (Preliminary result)   Collection Time: 08/12/18  8:54 PM  Result Value Ref Range Status   Specimen Description   Final    BLOOD Performed at [facility], [address] [place], [place] [address]    Special Requests   Final    BOTTLES DRAWN AEROBIC AND ANAEROBIC Blood Culture adequate volume Performed at [facility], [address] [place], [place] [address]    Culture    Final    NO GROWTH < 24 HOURS Performed at [facility], [address] [place], [place] [address]    Report Status PENDING  Incomplete  Blood Culture (routine x 2)     Status: None (Preliminary result)   Collection Time: 08/12/18  9:00 PM  Result Value Ref Range Status   Specimen Description   Final    BLOOD Performed at [facility], [address] [place], [place] [address]    Special Requests   Final    BOTTLES DRAWN AEROBIC AND ANAEROBIC Blood Culture adequate volume Performed at [facility], [address] [place], [place] [address]    Culture   Final    NO GROWTH < 24 HOURS Performed at [facility], [address] [place], [place] [address]    Report Status PENDING  Incomplete  MRSA PCR Screening     Status: None   Collection Time: 08/13/18  2:06 PM  Result Value Ref Range Status   MRSA by PCR NEGATIVE NEGATIVE Final    Comment:        The GeneXpert MRSA Assay (FDA approved for NASAL specimens only), is one component of a comprehensive MRSA colonization surveillance program. It is  not intended to diagnose MRSA infection nor to guide or monitor treatment for MRSA infections. Performed at [facility], [address], [place], [place] [address]       Studies: No results found.  Scheduled Meds: . dextromethorphan-guaiFENesin  2 tablet Oral BID  . doxycycline  100 mg Oral Q12H  . enoxaparin (LOVENOX) injection  40 mg Subcutaneous QHS  . ipratropium-albuterol  3 mL Nebulization TID  . methylPREDNISolone (SOLU-MEDROL) injection  60 mg Intravenous Q12H  . sodium chloride HYPERTONIC  4 mL Nebulization TID    Continuous Infusions: . sodium chloride 125 mL/hr at 08/13/18 1356  . ceFEPime (MAXIPIME) IV Stopped (08/13/18 1428)  . vancomycin       LOS: 0 days     Darlin Drop, MD [facility] Pager [phone]  If 7PM-7AM, please contact  night-coverage [email or web address] Password [facility] 08/13/2018, 5:49 PM

## 2018-08-13 NOTE — Progress Notes (Signed)
Pt arrived to unit with spouse room 1508. Alert and oriented x 4 , very weak with coughing spells. VS taken. callbell within reach.pt oriented to room with no complications. Initial assessment completed. Will continue to monitor. Pt guide at the bedside

## 2018-08-13 NOTE — ED Notes (Signed)
ED TO INPATIENT HANDOFF REPORT  Name/Age/Gender Bryan Adams 37 y.o. male  Code Status    Code Status Orders  (From admission, onward)         Start     Ordered   08/13/18 0027  Full code  Continuous     08/13/18 0027        Code Status History    This patient has a current code status but no historical code status.      Home/SNF/Other Home  Chief Complaint pneumonia  Level of Care/Admitting Diagnosis ED Disposition    ED Disposition Condition Comment   Admit  Hospital Area: [facility] [[id number]]  Level of Care: Telemetry [5]  Admit to tele based on following criteria: Other see comments  Comments: sepsis and SOB  Diagnosis: Lobar pneumonia [facility]) [[id number]]  Admitting Physician: Ivor Costa [[id number]]  Attending Physician: Ivor Costa [[id number]]  PT Class (Do Not Modify): Observation [104]  PT Acc Code (Do Not Modify): Observation [10022]       Medical History History reviewed. No pertinent past medical history.  Allergies No Known Allergies  IV Location/Drains/Wounds Patient Lines/Drains/Airways Status   Active Line/Drains/Airways    Name:   Placement date:   Placement time:   Site:   Days:   Peripheral IV 08/12/18 Left Antecubital   08/12/18    2118    Antecubital   1          Labs/Imaging Results for orders placed or performed during the hospital encounter of 08/12/18 (from the past 48 hour(s))  CBC with Differential/Platelet     Status: None   Collection Time: 08/12/18  8:54 PM  Result Value Ref Range   WBC 7.2 4.0 - 10.5 K/uL   RBC 4.66 4.22 - 5.81 MIL/uL   Hemoglobin 13.7 13.0 - 17.0 g/dL   HCT 41.4 39.0 - 52.0 %   MCV 88.8 78.0 - 100.0 fL   MCH 29.4 26.0 - 34.0 pg   MCHC 33.1 30.0 - 36.0 g/dL   RDW 14.2 11.5 - 15.5 %   Platelets 288 150 - 400 K/uL   Neutrophils Relative % 79 %   Neutro Abs 5.6 1.7 - 7.7 K/uL   Lymphocytes Relative 14 %   Lymphs Abs 1.0 0.7 - 4.0 K/uL   Monocytes Relative 6 %   Monocytes Absolute  0.5 0.1 - 1.0 K/uL   Eosinophils Relative 1 %   Eosinophils Absolute 0.1 0.0 - 0.7 K/uL   Basophils Relative 0 %   Basophils Absolute 0.0 0.0 - 0.1 K/uL    Comment: Performed at [facility], [place] [address] [place], [place] [address]  Basic metabolic panel     Status: Abnormal   Collection Time: 08/12/18  8:54 PM  Result Value Ref Range   Sodium 139 135 - 145 mmol/L   Potassium 3.6 3.5 - 5.1 mmol/L   Chloride 103 98 - 111 mmol/L   CO2 25 22 - 32 mmol/L   Glucose, Bld 96 70 - 99 mg/dL   BUN 14 6 - 20 mg/dL   Creatinine, Ser 1.25 (H) 0.61 - 1.24 mg/dL   Calcium 8.5 (L) 8.9 - 10.3 mg/dL   GFR calc non Af Amer >60 >60 mL/min   GFR calc Af Amer >60 >60 mL/min    Comment: (NOTE) The eGFR has been calculated using the CKD EPI equation. This calculation has not been validated in all clinical situations. eGFR's persistently <60 mL/min signify possible Chronic  Kidney Disease.    Anion gap 11 5 - 15    Comment: Performed at [facility], [place] [address], [place], [place] [address]  I-Stat CG4 Lactic Acid, ED     Status: None   Collection Time: 08/12/18  8:57 PM  Result Value Ref Range   Lactic Acid, Venous 0.81 0.5 - 1.9 mmol/L   Dg Chest 2 View  Result Date: 08/11/2018 CLINICAL DATA:  Cough EXAM: CHEST - 2 VIEW COMPARISON:  None. FINDINGS: Consolidation in the posterior basal segment of the left lower lobe. Normal heart size. Lungs otherwise clear. No pneumothorax. No pleural effusion. IMPRESSION: Left lower lobe pneumonia. Followup PA and lateral chest X-ray is recommended in 3-4 weeks following trial of antibiotic therapy to ensure resolution and exclude underlying malignancy. Electronically Signed   By: Marybelle Killings M.D.   On: 08/11/2018 20:15    Pending Labs Unresulted Labs (From admission, onward)    Start     Ordered   08/13/18 0027  Legionella Pneumophila Serogp 1 Ur Ag  Once,   R     08/13/18 0027   08/13/18 0026  Culture,  sputum-assessment  Once,   R     08/13/18 0027   08/13/18 0026  Gram stain  Once,   R     08/13/18 0027   08/13/18 0026  HIV antibody (Routine Screening)  Once,   R     08/13/18 0027   08/13/18 0026  Strep pneumoniae urinary antigen  Once,   R     08/13/18 0027   08/13/18 0024  Respiratory Panel by PCR  (Respiratory virus panel)  Once,   R     08/13/18 0023   08/13/18 0023  Lactic acid, plasma  Once,   STAT     08/13/18 0022   08/13/18 0023  Procalcitonin  STAT,   R     08/13/18 0022   08/12/18 2043  Blood Culture (routine x 2)  BLOOD CULTURE X 2,   STAT     08/12/18 2043          Vitals/Pain Today's Vitals   08/12/18 2230 08/12/18 2245 08/12/18 2325 08/12/18 2342  BP: 134/75 134/67  127/68  Pulse: (!) 102 93  (!) 109  Resp: 19 19  (!) 22  Temp:   (!) 100.4 F (38 C)   TempSrc:      SpO2: 96% 97%  97%  PainSc:        Isolation Precautions Droplet precaution  Medications Medications  sodium chloride 0.9 % bolus 2,000 mL (2,000 mLs Intravenous New Bag/Given 08/13/18 0014)  HYDROcodone-acetaminophen (NORCO/VICODIN) 5-325 MG per tablet 1 tablet (has no administration in time range)  0.9 %  sodium chloride infusion (has no administration in time range)  aspirin-acetaminophen-caffeine (EXCEDRIN MIGRAINE) per tablet 1 tablet (has no administration in time range)  levalbuterol (XOPENEX) nebulizer solution 1.25 mg (has no administration in time range)  ipratropium (ATROVENT) nebulizer solution 0.5 mg (has no administration in time range)  dextromethorphan-guaiFENesin (MUCINEX DM) 30-600 MG per 12 hr tablet 1 tablet (has no administration in time range)  doxycycline (VIBRA-TABS) tablet 100 mg (has no administration in time range)  enoxaparin (LOVENOX) injection 40 mg (has no administration in time range)  ondansetron (ZOFRAN) injection 4 mg (has no administration in time range)  acetaminophen (TYLENOL) tablet 650 mg (has no administration in time range)  zolpidem (AMBIEN)  tablet 5 mg (has no administration in time range)  sodium chloride 0.9 %  bolus 1,000 mL (0 mLs Intravenous Stopped 08/12/18 2313)  ceFEPIme (MAXIPIME) 2 g in sodium chloride 0.9 % 100 mL IVPB (0 g Intravenous Stopped 08/13/18 0009)  ipratropium-albuterol (DUONEB) 0.5-2.5 (3) MG/3ML nebulizer solution 3 mL (3 mLs Nebulization Given 08/12/18 2051)  vancomycin (VANCOCIN) 2,000 mg in sodium chloride 0.9 % 500 mL IVPB (0 mg Intravenous Stopped 08/12/18 2313)  ketorolac (TORADOL) 30 MG/ML injection 30 mg (30 mg Intravenous Given 08/12/18 2121)  metoCLOPramide (REGLAN) injection 10 mg (10 mg Intravenous Given 08/12/18 2121)  diphenhydrAMINE (BENADRYL) injection 25 mg (25 mg Intravenous Given 08/12/18 2121)  acetaminophen (TYLENOL) tablet 1,000 mg (1,000 mg Oral Given 08/12/18 2320)    Mobility walks

## 2018-08-13 NOTE — Evaluation (Addendum)
Physical Therapy Evaluation-1x Patient Details Name: Bryan EvesMichael A Adams MRN: [id number] DOB: 11/20/1981 Today's Date: 08/13/2018   History of Present Illness  37 yo male admitted with pna, sepsis. Hx of HA  Clinical Impression  On eval, pt was Supervision-Min guard assist for mobility. He walked ~150 feet without a device. He is mildly unsteady. He appears fatigued and generally weaker than his baseline. Pt's significant other was present and was very willing to assist him as needed. Pt does not have any acute PT needs. Recommend hallway ambulation/increased activity as tolerated. Pt's significant other stated she felt comfortable walking with him. 1x eval. Will sign off. Please reorder if mobility needs change.      Follow Up Recommendations No PT follow up;Supervision for mobility/OOB    Equipment Recommendations  None recommended by PT    Recommendations for Other Services       Precautions / Restrictions Precautions Precautions: Fall Restrictions Weight Bearing Restrictions: No      Mobility  Bed Mobility Overal bed mobility: Modified Independent                Transfers Overall transfer level: Modified independent                  Ambulation/Gait Ambulation/Gait assistance: Supervision; Min guard Assist Gait Distance (Feet): 150 Feet Assistive device: 1 person hand held assist Gait Pattern/deviations: Step-through pattern     General Gait Details: wife insisted on holding pt's hand. Mildly unsteady but no overt LOB. slow gait speed. O2 90-91% on RA, HR 120s with ambulation.   Stairs            Wheelchair Mobility    Modified Rankin (Stroke Patients Only)       Balance Overall balance assessment: Mild deficits observed, not formally tested                                           Pertinent Vitals/Pain Pain Assessment: Faces Faces Pain Scale: Hurts little more Pain Location: headache Pain Descriptors / Indicators:  Aching Pain Intervention(s): Monitored during session    Home Living Family/patient expects to be discharged to:: Private residence Living Arrangements: Spouse/significant other;Children   Type of Home: House       Home Layout: Two level Home Equipment: None      Prior Function Level of Independence: Independent         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     Hand Dominance        Extremity/Trunk Assessment   Upper Extremity Assessment Upper Extremity Assessment: Overall WFL for tasks assessed    Lower Extremity Assessment Lower Extremity Assessment: Overall WFL for tasks assessed    Cervical / Trunk Assessment Cervical / Trunk Assessment: Normal  Communication   Communication: No difficulties  Cognition Arousal/Alertness: Awake/alert Behavior During Therapy: Flat affect Overall Cognitive Status: Within Functional Limits for tasks assessed                                 General Comments: pt feels bad      General Comments      Exercises     Assessment/Plan    PT Assessment Patent does not need any further PT services  PT Problem List         PT Treatment Interventions  PT Goals (Current goals can be found in the Care Plan section)  Acute Rehab PT Goals Patient Stated Goal: to feel better PT Goal Formulation: All assessment and education complete, DC therapy    Frequency     Barriers to discharge        Co-evaluation               AM-PAC PT "6 Clicks" Daily Activity  Outcome Measure Difficulty turning over in bed (including adjusting bedclothes, sheets and blankets)?: None Difficulty moving from lying on back to sitting on the side of the bed? : None Difficulty sitting down on and standing up from a chair with arms (e.g., wheelchair, bedside commode, etc,.)?: None Help needed moving to and from a bed to chair (including a wheelchair)?: None Help needed walking in hospital room?: A Little Help needed climbing 3-5  steps with a railing? : A Little 6 Click Score: 22    End of Session   Activity Tolerance: Patient limited by fatigue Patient left: in bed;with call bell/phone within reach;with family/visitor present        Time: 4034-7425 PT Time Calculation (min) (ACUTE ONLY): 12 min   Charges:   PT Evaluation $PT Eval Low Complexity: 1 Low            Rebeca Alert, PT Acute Rehabilitation Services Pager: [phone] Office: [phone]

## 2018-08-13 NOTE — Care Management (Signed)
[id number]/GNOIBB Davis,BSN,RN3,CCM:  Discharge Readiness Milestones  (1st Day of chart review)  MCG Used: pnuemonia Optimal GLOS: 0-2 Hospital Day: 1  Discharge Readiness Milestones Hospitalization Return to top of Pneumonia RRG - ISC Goal Length of Stay: Ambulatory or 2 days  Note: Goal Length of Stay assumes optimal recovery, decision making, and care. Patients may be discharged to a lower level of care (either later than or sooner than the goal) when it is appropriate for their clinical status and care needs. Discharge Readiness Return to top of Pneumonia RRG - ISC  Discharge readiness is indicated by patient meeting Recovery Milestones, including ALL of the following: ? Hemodynamic stability no temp 101.9,/hr=119/bp=153/106 ? Tachypnea=24 ? Hypoxemia  ? febrile,  ? Oxygen  ? Mental status at baseline ? Antibiotic regimen  ? Iv maxipime and iv vancomycin ? Ambulatory ? Oral hydration, medications, and diet ? IV ns at 100cc/hrs AND REGULAR DIET ?   Identified Barriers: Date/Name case discussed with attending MD (if applicable):  Date/name case discussed with supervisor (if applicable):  Date/name case referred to physician advisor (if applicable)   Concurrent Review - Discharge Readiness Milestones, Not Met

## 2018-08-14 DIAGNOSIS — J181 Lobar pneumonia, unspecified organism: Secondary | ICD-10-CM

## 2018-08-14 LAB — BASIC METABOLIC PANEL
Anion gap: 9 (ref 5–15)
BUN: 9 mg/dL (ref 6–20)
CALCIUM: 8.4 mg/dL — AB (ref 8.9–10.3)
CO2: 26 mmol/L (ref 22–32)
CREATININE: 0.95 mg/dL (ref 0.61–1.24)
Chloride: 104 mmol/L (ref 98–111)
GFR calc Af Amer: >60 mL/min (ref >60)
GFR calc non Af Amer: >60 mL/min (ref >60)
GLUCOSE: 148 mg/dL — AB (ref 70–99)
Potassium: 3.7 mmol/L (ref 3.5–5.1)
Sodium: 139 mmol/L (ref 135–145)

## 2018-08-14 LAB — CBC
HEMATOCRIT: 41.1 % (ref 39.0–52.0)
Hemoglobin: 13.6 g/dL (ref 13.0–17.0)
MCH: 29.2 pg (ref 26.0–34.0)
MCHC: 33.1 g/dL (ref 30.0–36.0)
MCV: 88.4 fL (ref 78.0–100.0)
Platelets: 227 10*3/uL (ref 150–400)
RBC: 4.65 MIL/uL (ref 4.22–5.81)
RDW: 14 % (ref 11.5–15.5)
WBC: 5.3 10*3/uL (ref 4.0–10.5)

## 2018-08-14 LAB — LEGIONELLA PNEUMOPHILA SEROGP 1 UR AG: L. PNEUMOPHILA SEROGP 1 UR AG: NEGATIVE

## 2018-08-14 MED ORDER — BENZONATATE 100 MG PO CAPS: 100.0000 mg | ORAL_CAPSULE | Freq: Three times a day (TID) | ORAL | 0 refills | Status: AC | PRN

## 2018-08-14 MED ORDER — SUMATRIPTAN SUCCINATE 50 MG PO TABS: 50.0000 mg | ORAL_TABLET | Freq: Two times a day (BID) | ORAL | 0 refills | Status: DC | PRN

## 2018-08-14 MED ORDER — PREDNISONE 10 MG (21) PO TBPK: ORAL_TABLET | Freq: Every day | ORAL | 0 refills | Status: DC

## 2018-08-14 MED ORDER — DOXYCYCLINE HYCLATE 100 MG PO TABS: 100.0000 mg | ORAL_TABLET | Freq: Two times a day (BID) | ORAL | 0 refills | Status: AC

## 2018-08-14 MED ORDER — ALBUTEROL SULFATE HFA 108 (90 BASE) MCG/ACT IN AERS: 2.0000 | INHALATION_SPRAY | Freq: Four times a day (QID) | RESPIRATORY_TRACT | 2 refills | Status: DC | PRN

## 2018-08-14 NOTE — Progress Notes (Signed)
Discharge instructions and medications discussed with patient and spouse.  AVS and prescription given to patient.  All questions answered.

## 2018-08-14 NOTE — Discharge Summary (Signed)
Discharge Summary  Bryan Adams VWU:[id number] DOB: 1981/03/14  PCP: Patient, No Pcp Per  Admit date: 08/12/2018 Discharge date: 08/14/2018  Time spent: 30 minutes  Recommendations for Outpatient Follow-up:  1. Recheck chest x-ray  Discharge Diagnoses:  Active Hospital Problems   Diagnosis Date Noted  . Lobar pneumonia (HCC) 08/12/2018  . Acute metabolic encephalopathy   . Sepsis (HCC) 08/12/2018    Resolved Hospital Problems  No resolved problems to display.    Discharge Condition: Stable improved  Diet recommendation: Regular  Vitals:   08/14/18 0331 08/14/18 0829  BP: (!) 132/95   Pulse: 96   Resp: (!) 21   Temp: 99.1 F (37.3 C)   SpO2: 97% 93%    History of present illness:   Bryan Adams is a 37 y.o. male with medical history significant of occasional headaches, who presents with cough, fever, chills, shortness of breath and mild confusion.  Patient states that he has been having cough, mild shortness of breath, fever and chills for more than 2 weeks.  He also has mild chest pain which is induced by coughing.  Mostly patient just has dry cough. Pt was diagnosed with pneumonia 3 days ago at the [place]. Has not had any improvement despite taking azithro. He was seen yesterday at [facility] and had CXR that showed left lower lobe pneumonia. he was given fluids and rocephin injection. He was discharged on Vantin. Per his wife, pt continues to have cough and mild shortness of breath.  He becomes lethargic and mildly confused. When saw pt in  ED, he is lethargic, but oriented x3.  He answers all questions appropriately.  Patient states that he has headache behind both eyes, no neck rigidity. No recent long distant traveling.  No tenderness in the calf areas.  Patient had loose stool yesterday, which has resolved.  Currently patient does not have nausea, vomiting, diarrhea or abdominal pain.  Denies symptoms of UTI.  No unilateral weakness.  Patient is strongly denies  any fall or head injury recently.  Patient states that he is getting testosterone injection due to lower testosterone level, last dose was on Monday.   Hospital Course:  Principal Problem:   Lobar pneumonia (HCC) Active Problems:   Sepsis (HCC)   Acute metabolic encephalopathy Community-acquired lower lobe pneumonia in this 37 year old healthy [employer] veteran.  With positive adenovirus by respiratory test he most likely had the respiratory virus which was complicated by the pneumonia left lober pneumonia he was treated with cefepime IV and he will continue on Vantin that was started prior to his admission.  He received vancomycin but when MRI MRSA PCR was negative it was discontinued. He noted headache and was started on Imitrex for migraine headache he also had received a cocktail with IV Reglan Toradol and Benadryl  Procedures:  None  Consultations:  None  Discharge Exam: BP (!) 132/95   Pulse 96   Temp 99.1 F (37.3 C)   Resp (!) 21   SpO2 93% Comment: md sp02 goal >=92%  General: Alert oriented x3 pleasant well-nourished Cardiovascular: Regular rate and rhythm no murmur Respiratory: Still coughing especially irritating cough.  He still has little bit of bronchial breath sounds on the left.  No wheezing no rhonchi Musculoskeletal full range of motion he is ambulatory.  There is no edema  Discharge Instructions You were cared for by a hospitalist during your hospital stay. If you have any questions about your discharge medications or the care you received while you  were in the hospital after you are discharged, you can call the unit and asked to speak with the hospitalist on call if the hospitalist that took care of you is not available. Once you are discharged, your primary care physician will handle any further medical issues. Please note that NO REFILLS for any discharge medications will be authorized once you are discharged, as it is imperative that you return to your primary care  physician (or establish a relationship with a primary care physician if you do not have one) for your aftercare needs so that they can reassess your need for medications and monitor your lab values.  Discharge Instructions    Call MD for:   Complete by:  As directed    Call MD for:  temperature >100.4   Complete by:  As directed    Diet - low sodium heart healthy   Complete by:  As directed    Discharge instructions   Complete by:  As directed    PCP follow-up in 1 to 2 week to repeat chest x-ray x-ray 39 adenovirus infection.  No treatment is needed but is important to have good handwashing ,avoid touching eyes nose and mouth with unwashed hands and close contact with person with a adenovirus   Increase activity slowly   Complete by:  As directed      Allergies as of 08/14/2018   No Known Allergies     Medication List    TAKE these medications   albuterol 108 (90 Base) MCG/ACT inhaler Commonly known as:  PROVENTIL HFA;VENTOLIN HFA Inhale 2 puffs into the lungs every 6 (six) hours as needed for wheezing or shortness of breath.   benzonatate 100 MG capsule Commonly known as:  TESSALON Take 1 capsule (100 mg total) by mouth 3 (three) times daily as needed for cough.   cefpodoxime 200 MG tablet Commonly known as:  VANTIN Take 1 tablet (200 mg total) by mouth 2 (two) times daily.   doxycycline 100 MG tablet Commonly known as:  VIBRA-TABS Take 1 tablet (100 mg total) by mouth 2 (two) times daily for 5 days.   HYDROcodone-acetaminophen 5-325 MG tablet Commonly known as:  NORCO/VICODIN Take 1-2 tablets by mouth every 4 (four) hours as needed for severe pain.   predniSONE 10 MG (21) Tbpk tablet Commonly known as:  STERAPRED UNI-PAK 21 TAB Take by mouth daily. 30 mg daily x2 day then 20 mg daily x2 THEN 10 mg daily x2  THEN5 mg daily x2   SUMAtriptan 50 MG tablet Commonly known as:  IMITREX Take 1 tablet (50 mg total) by mouth 2 (two) times daily as needed for migraine or  headache (unresponsive to Excedrin). May repeat in 2 hours if headache persists or recurs.      No Known Allergies    The results of significant diagnostics from this hospitalization (including imaging, microbiology, ancillary and laboratory) are listed below for reference.    Significant Diagnostic Studies: Dg Chest 2 View  Result Date: 08/11/2018 CLINICAL DATA:  Cough EXAM: CHEST - 2 VIEW COMPARISON:  None. FINDINGS: Consolidation in the posterior basal segment of the left lower lobe. Normal heart size. Lungs otherwise clear. No pneumothorax. No pleural effusion. IMPRESSION: Left lower lobe pneumonia. Followup PA and lateral chest X-ray is recommended in 3-4 weeks following trial of antibiotic therapy to ensure resolution and exclude underlying malignancy. Electronically Signed   By: Jolaine Click M.D.   On: 08/11/2018 20:15    Microbiology: Recent Results (from the  past 240 hour(s))  Blood Culture (routine x 2)     Status: None (Preliminary result)   Collection Time: 08/12/18  8:54 PM  Result Value Ref Range Status   Specimen Description   Final    BLOOD Performed at [facility], [address] [place], [place] [address]    Special Requests   Final    BOTTLES DRAWN AEROBIC AND ANAEROBIC Blood Culture adequate volume Performed at [facility], [address] [place], [place] [address]    Culture   Final    NO GROWTH 2 DAYS Performed at [facility], [address] [place], [place] [address]    Report Status PENDING  Incomplete  Blood Culture (routine x 2)     Status: None (Preliminary result)   Collection Time: 08/12/18  9:00 PM  Result Value Ref Range Status   Specimen Description   Final    BLOOD Performed at [facility], [address] [place], [place] [address]    Special Requests   Final    BOTTLES DRAWN AEROBIC AND ANAEROBIC Blood Culture adequate volume Performed at [facility],  [address] [place], [place] [address]    Culture   Final    NO GROWTH 2 DAYS Performed at [facility], [address] [place], [place] [address]    Report Status PENDING  Incomplete  Respiratory Panel by PCR     Status: Abnormal   Collection Time: 08/13/18  3:23 AM  Result Value Ref Range Status   Adenovirus DETECTED (A) NOT DETECTED Final   Coronavirus 229E NOT DETECTED NOT DETECTED Final   Coronavirus HKU1 NOT DETECTED NOT DETECTED Final   Coronavirus NL63 NOT DETECTED NOT DETECTED Final   Coronavirus OC43 NOT DETECTED NOT DETECTED Final   Metapneumovirus NOT DETECTED NOT DETECTED Final   Rhinovirus / Enterovirus NOT DETECTED NOT DETECTED Final   Influenza A NOT DETECTED NOT DETECTED Final   Influenza B NOT DETECTED NOT DETECTED Final   Parainfluenza Virus 1 NOT DETECTED NOT DETECTED Final   Parainfluenza Virus 2 NOT DETECTED NOT DETECTED Final   Parainfluenza Virus 3 NOT DETECTED NOT DETECTED Final   Parainfluenza Virus 4 NOT DETECTED NOT DETECTED Final   Respiratory Syncytial Virus NOT DETECTED NOT DETECTED Final   Bordetella pertussis NOT DETECTED NOT DETECTED Final   Chlamydophila pneumoniae NOT DETECTED NOT DETECTED Final   Mycoplasma pneumoniae NOT DETECTED NOT DETECTED Final    Comment: Performed at [facility], [address] [place], [place] [address]  MRSA PCR Screening     Status: None   Collection Time: 08/13/18  2:06 PM  Result Value Ref Range Status   MRSA by PCR NEGATIVE NEGATIVE Final    Comment:        The GeneXpert MRSA Assay (FDA approved for NASAL specimens only), is one component of a comprehensive MRSA colonization surveillance program. It is not intended to diagnose MRSA infection nor to guide or monitor treatment for MRSA infections. Performed at [facility], [address] [place], [place] [address]      Labs: Basic Metabolic Panel: Recent Labs  Lab 08/11/18 2002 08/12/18 2054 08/14/18 0520  NA  140 139 139  K 3.4* 3.6 3.7  CL 102 103 104  CO2 25 25 26   GLUCOSE 105* 96 148*  BUN 11 14 9   CREATININE 1.14 1.25* 0.95  CALCIUM 8.6* 8.5* 8.4*   Liver Function  Tests: No results for input(s): AST, ALT, ALKPHOS, BILITOT, PROT, ALBUMIN in the last 168 hours. No results for input(s): LIPASE, AMYLASE in the last 168 hours. No results for input(s): AMMONIA in the last 168 hours. CBC: Recent Labs  Lab 08/11/18 2002 08/12/18 2054 08/14/18 0520  WBC 7.6 7.2 5.3  NEUTROABS 5.4 5.6  --   HGB 15.6 13.7 13.6  HCT 45.3 41.4 41.1  MCV 88.1 88.8 88.4  PLT 278 288 227   Cardiac Enzymes: No results for input(s): CKTOTAL, CKMB, CKMBINDEX, TROPONINI in the last 168 hours. BNP: BNP (last 3 results) No results for input(s): BNP in the last 8760 hours.  ProBNP (last 3 results) No results for input(s): PROBNP in the last 8760 hours.  CBG: No results for input(s): GLUCAP in the last 168 hours.     Signed:  Myrtie Neither, MD [facility] 08/14/2018, 1:44 PM

## 2018-08-17 LAB — CULTURE, BLOOD (ROUTINE X 2)
CULTURE: NO GROWTH
Culture: NO GROWTH
SPECIAL REQUESTS: ADEQUATE
Special Requests: ADEQUATE

## 2019-08-15 ENCOUNTER — Emergency Department (HOSPITAL_BASED_OUTPATIENT_CLINIC_OR_DEPARTMENT_OTHER): Payer: No Typology Code available for payment source

## 2019-08-15 ENCOUNTER — Other Ambulatory Visit: Payer: Self-pay

## 2019-08-15 ENCOUNTER — Emergency Department (HOSPITAL_BASED_OUTPATIENT_CLINIC_OR_DEPARTMENT_OTHER)
Admission: EM | Admit: 2019-08-15 | Discharge: 2019-08-15 | Disposition: A | Discharge status: Home / Self Care | Payer: No Typology Code available for payment source | Attending: Emergency Medicine | Admitting: Emergency Medicine

## 2019-08-15 ENCOUNTER — Encounter (HOSPITAL_BASED_OUTPATIENT_CLINIC_OR_DEPARTMENT_OTHER): Payer: Self-pay | Admitting: Emergency Medicine

## 2019-08-15 DIAGNOSIS — R0602 Shortness of breath: Secondary | ICD-10-CM | POA: Diagnosis not present

## 2019-08-15 DIAGNOSIS — Z79899 Other long term (current) drug therapy: Secondary | ICD-10-CM | POA: Diagnosis not present

## 2019-08-15 DIAGNOSIS — M79605 Pain in left leg: Secondary | ICD-10-CM | POA: Diagnosis not present

## 2019-08-15 DIAGNOSIS — R0789 Other chest pain: Secondary | ICD-10-CM | POA: Diagnosis present

## 2019-08-15 LAB — BASIC METABOLIC PANEL
Anion gap: 10 (ref 5–15)
BUN: 14 mg/dL (ref 6–20)
CO2: 25 mmol/L (ref 22–32)
Calcium: 9.1 mg/dL (ref 8.9–10.3)
Chloride: 104 mmol/L (ref 98–111)
Creatinine, Ser: 1.14 mg/dL (ref 0.61–1.24)
GFR calc Af Amer: >60 mL/min (ref >60)
GFR calc non Af Amer: >60 mL/min (ref >60)
Glucose, Bld: 144 mg/dL — ABNORMAL HIGH (ref 70–99)
Potassium: 3.9 mmol/L (ref 3.5–5.1)
Sodium: 139 mmol/L (ref 135–145)

## 2019-08-15 LAB — CBC
HCT: 48.4 % (ref 39.0–52.0)
Hemoglobin: 15.6 g/dL (ref 13.0–17.0)
MCH: 30.2 pg (ref 26.0–34.0)
MCHC: 32.2 g/dL (ref 30.0–36.0)
MCV: 93.6 fL (ref 80.0–100.0)
Platelets: 323 10*3/uL (ref 150–400)
RBC: 5.17 MIL/uL (ref 4.22–5.81)
RDW: 13.2 % (ref 11.5–15.5)
WBC: 7.8 10*3/uL (ref 4.0–10.5)
nRBC: 0 % (ref 0.0–0.2)

## 2019-08-15 LAB — D-DIMER, QUANTITATIVE: D-Dimer, Quant: <0.27 ug/mL-FEU (ref 0.00–0.50)

## 2019-08-15 LAB — TROPONIN I (HIGH SENSITIVITY)
Troponin I (High Sensitivity): 17 ng/L (ref <18)
Troponin I (High Sensitivity): 20 ng/L — ABNORMAL HIGH (ref <18)

## 2019-08-15 MED ORDER — ASPIRIN 81 MG PO CHEW
324.0000 mg | CHEWABLE_TABLET | Freq: Once | ORAL | Status: AC
  Administered 2019-08-15: 324 mg via ORAL
  Filled 2019-08-15: qty 4

## 2019-08-15 NOTE — ED Triage Notes (Signed)
Onset of chest pain today after mowing today 2 hours ago. Radiated to left arm and jaw. Mid sternal pain on arrival. Pt states same sx for over a year off and on.

## 2019-08-15 NOTE — ED Provider Notes (Signed)
[facility] EMERGENCY DEPARTMENT Provider Note   CSN: [id number] Arrival date & time: 08/15/19  1656     History   Chief Complaint Chief Complaint  Patient presents with   Chest Pain    HPI Bryan Adams is a 38 y.o. male presents today for evaluation of acute onset, resolved episode of chest pain that began around 4 PM and lasted around 2 hours.  He reports that symptoms began while he was mowing the lawn.  The pain was described as a tingling sharp shooting pain to a focal area along the left chest.  The pain radiated to the left molars and left upper extremity.  He reports that when the pain in the chest intensifies he will also experience it in his molars.  He notes associated shortness of breath with this.  He states that for the last year or so he has been experiencing chest pains similar to this mostly with exertion but at times at rest as well.  It is sometimes associated with lightheadedness.  He will have periods of diaphoresis as well.  Denies nausea, vomiting, abdominal pain.  He does note that at times he will have sharp shooting pain and numbness and tingling of his left upper extremity independent of the chest pain.  He is a non-smoker, denies recreational drug use, no alcohol intake.  No family history of heart disease at a young age in first-degree relatives.  He did travel to [place] recently, noted his chest pains worsened with elevation.  Yesterday noticed some soreness to his left calf but no swelling.  No hemoptysis, no prior history of DVT or PE, no hormone replacement therapy.    The history is provided by the patient.    Past Medical History:  Diagnosis Date   Headache     Patient Active Problem List   Diagnosis Date Noted   Acute metabolic encephalopathy    Lobar pneumonia (HCC) 08/12/2018   Sepsis (HCC) 08/12/2018    History reviewed. No pertinent surgical history.      Home Medications    Prior to Admission medications     Medication Sig Start Date End Date Taking? Authorizing Provider  albuterol (PROVENTIL HFA;VENTOLIN HFA) 108 (90 Base) MCG/ACT inhaler Inhale 2 puffs into the lungs every 6 (six) hours as needed for wheezing or shortness of breath. 08/14/18   Myrtie Neither, MD  cefpodoxime (VANTIN) 200 MG tablet Take 1 tablet (200 mg total) by mouth 2 (two) times daily. 08/11/18   Terrilee Files, MD  HYDROcodone-acetaminophen (NORCO/VICODIN) 5-325 MG tablet Take 1-2 tablets by mouth every 4 (four) hours as needed for severe pain. 08/11/18   Terrilee Files, MD  predniSONE (STERAPRED UNI-PAK 21 TAB) 10 MG (21) TBPK tablet Take by mouth daily. 30 mg daily x2 day then 20 mg daily x2 THEN 10 mg daily x2  THEN5 mg daily x2 08/14/18   Myrtie Neither, MD  SUMAtriptan (IMITREX) 50 MG tablet Take 1 tablet (50 mg total) by mouth 2 (two) times daily as needed for migraine or headache (unresponsive to Excedrin). May repeat in 2 hours if headache persists or recurs. 08/14/18   Myrtie Neither, MD    Family History Family History  Problem Relation Age of Onset   Diabetes Mellitus II Father     Social History Social History   Tobacco Use   Smoking status: Never Smoker   Smokeless tobacco: Never Used  Substance Use Topics   Alcohol use: Never    Frequency:  Never   Drug use: Never     Allergies   Patient has no known allergies.   Review of Systems Review of Systems  Constitutional: Positive for diaphoresis. Negative for chills and fever.  Respiratory: Positive for shortness of breath. Negative for cough.   Cardiovascular: Positive for chest pain. Negative for leg swelling.  Gastrointestinal: Negative for abdominal pain, nausea and vomiting.  Neurological: Positive for light-headedness.  All other systems reviewed and are negative.    Physical Exam Updated Vital Signs BP (!) 144/82 (BP Location: Right Arm)    Pulse 76    Temp 98.3 F (36.8 C) (Oral)    Resp 20    Ht 5\' 7"  (1.702 m)    Wt 93 kg     SpO2 96%    BMI 32.11 kg/m   Physical Exam Vitals signs and nursing note reviewed.  Constitutional:      General: He is not in acute distress.    Appearance: He is well-developed.     Comments: Resting comfortably in bed  HENT:     Head: Normocephalic and atraumatic.  Eyes:     General:        Right eye: No discharge.        Left eye: No discharge.     Conjunctiva/sclera: Conjunctivae normal.  Neck:     Vascular: No JVD.     Trachea: No tracheal deviation.  Cardiovascular:     Rate and Rhythm: Normal rate and regular rhythm.     Pulses:          Radial pulses are 2+ on the right side and 2+ on the left side.       Dorsalis pedis pulses are 2+ on the right side and 2+ on the left side.       Posterior tibial pulses are 2+ on the right side and 2+ on the left side.     Comments: Mild left lateral calf tenderness.  Homans sign absent bilaterally.  No lower extremity edema, compartments are soft.  No palpable cords. Pulmonary:     Effort: Pulmonary effort is normal.     Breath sounds: No decreased breath sounds.     Comments: Speaking in full sentences without difficulty. Chest:     Chest wall: No tenderness.  Abdominal:     General: There is no distension.  Musculoskeletal:     Right lower leg: He exhibits no tenderness. No edema.     Left lower leg: He exhibits tenderness. No edema.  Skin:    General: Skin is warm and dry.     Findings: No erythema.  Neurological:     Mental Status: He is alert.  Psychiatric:        Behavior: Behavior normal.      ED Treatments / Results  Labs (all labs ordered are listed, but only abnormal results are displayed) Labs Reviewed  BASIC METABOLIC PANEL - Abnormal; Notable for the following components:      Result Value   Glucose, Bld 144 (*)    All other components within normal limits  TROPONIN I (HIGH SENSITIVITY) - Abnormal; Notable for the following components:   Troponin I (High Sensitivity) 20 (*)    All other components  within normal limits  CBC  D-DIMER, QUANTITATIVE (NOT AT [facility])  TROPONIN I (HIGH SENSITIVITY)    EKG EKG Interpretation  Date/Time:  Monday August 15 2019 17:09:16 EDT Ventricular Rate:  97 PR Interval:  194 QRS Duration: 88 QT Interval:  368 QTC Calculation: 467 R Axis:   56 Text Interpretation:  Normal sinus rhythm Cannot rule out Anterior infarct , age undetermined Abnormal ECG Confirmed by Benjiman CorePickering, Nathan [phone]([id number]) on 08/15/2019 7:58:48 PM Also confirmed by Benjiman CorePickering, Nathan [phone]), editor Elita QuickWatlington, Beverly [phone])  on 08/16/2019 9:48:54 AM   Radiology Dg Chest 2 View  Result Date: 08/15/2019 CLINICAL DATA:  Chest pain EXAM: CHEST - 2 VIEW COMPARISON:  August 11, 2018 FINDINGS: The heart size and mediastinal contours are within normal limits. Both lungs are clear. The visualized skeletal structures are unremarkable. IMPRESSION: No acute cardiopulmonary disease. Electronically Signed   By: Jonna ClarkBindu  Avutu M.D.   On: 08/15/2019 17:30   [place] Venous Img Lower  Left (dvt Study)  Result Date: 08/15/2019 CLINICAL DATA:  Left lower extremity pain for 2 days EXAM: Left LOWER EXTREMITY VENOUS DOPPLER ULTRASOUND TECHNIQUE: Gray-scale sonography with graded compression, as well as color Doppler and duplex ultrasound were performed to evaluate the lower extremity deep venous systems from the level of the common femoral vein and including the common femoral, femoral, profunda femoral, popliteal and calf veins including the posterior tibial, peroneal and gastrocnemius veins when visible. The superficial great saphenous vein was also interrogated. Spectral Doppler was utilized to evaluate flow at rest and with distal augmentation maneuvers in the common femoral, femoral and popliteal veins. COMPARISON:  None. FINDINGS: Contralateral Common Femoral Vein: Respiratory phasicity is normal and symmetric with the symptomatic side. No evidence of thrombus. Normal compressibility. Common Femoral Vein: No  evidence of thrombus. Normal compressibility, respiratory phasicity and response to augmentation. Saphenofemoral Junction: No evidence of thrombus. Normal compressibility and flow on color Doppler imaging. Profunda Femoral Vein: No evidence of thrombus. Normal compressibility and flow on color Doppler imaging. Femoral Vein: No evidence of thrombus. Normal compressibility, respiratory phasicity and response to augmentation. Popliteal Vein: No evidence of thrombus. Normal compressibility, respiratory phasicity and response to augmentation. Calf Veins: No evidence of thrombus. Normal compressibility and flow on color Doppler imaging. IMPRESSION: No evidence of deep venous thrombosis. Electronically Signed   By: Jasmine PangKim  Fujinaga M.D.   On: 08/15/2019 21:03    Procedures Procedures (including critical care time)  Medications Ordered in ED Medications  aspirin chewable tablet 324 mg (324 mg Oral Given 08/15/19 1932)     Initial Impression / Assessment and Plan / ED Course  I have reviewed the triage vital signs and the nursing notes.  Pertinent labs & imaging results that were available during my care of the patient were reviewed by me and considered in my medical decision making (see chart for details).  Patient presenting for evaluation of exertional chest pain that has since resolved.  He is afebrile, mildly hypertensive in the ED but otherwise vital signs are stable and he is nontoxic in appearance in no apparent distress.  The pain is not pleuritic or reproducible on palpation.  He has no significant risk factors for cardiac disease. HEART score of 3.   EKG shows normal sinus rhythm, slightly downgoing PR interval of in nonspecific leads but no acute ischemic abnormalities noted.  Chest x-ray shows no acute cardiopulmonary abnormalities, no cardiomegaly, no pleural effusion or pneumonia.  Given he recently traveled within the month cannot PERC out and a d-dimer was obtained which was negative.  He also  underwent DVT study of the left lower extremity where he was having some mild calf pain which was also negative for acute DVT.  Initial high-sensitivity troponin was 17, on recheck increased to 20.  Per new high-sensitivity troponin algorithm this warrants admission for observation and chest pain rule out.  However the patient does not wish to be admitted to the hospital.  He has been asymptomatic while he has been in the ED. doubt dissection, cardiac tamponade, esophageal rupture, pneumothorax, or pneumonia.  Overall he is low risk for cardiac disease however his history is concerning for anginal pain, and myself and Dr. Alvino Chapel both had extensive discussions regarding the utility of admission to the hospital but he is adamant that he would not like to stay.  He does have good follow-up at the [place] and we will give him referral to outpatient cardiology for reevaluation.  We discussed strict ED return precautions.  Patient and wife verbalized understanding of and agreement with plan and patient stable for discharge home at this time.  Final Clinical Impressions(s) / ED Diagnoses   Final diagnoses:  Intermittent left-sided chest pain    ED Discharge Orders    None       Debroah Baller 08/16/19 1613    Davonna Belling, MD 08/18/19 [phone]

## 2019-08-15 NOTE — Discharge Instructions (Signed)
Your workup today was not entirely normal. Follow up with a cardiologist will be very important for re-evaluation of your symptoms.   Return to the emergency department any with any concerning signs or symptoms develop such as worsening pain, shortness of breath, loss of consciousness, high fevers.

## 2019-11-14 ENCOUNTER — Other Ambulatory Visit: Payer: Self-pay

## 2019-11-14 DIAGNOSIS — Z20822 Contact with and (suspected) exposure to covid-19: Secondary | ICD-10-CM

## 2019-11-16 LAB — NOVEL CORONAVIRUS, NAA: SARS-CoV-2, NAA: NOT DETECTED

## 2021-07-09 ENCOUNTER — Other Ambulatory Visit: Payer: Self-pay | Admitting: Podiatry

## 2021-07-09 MED ORDER — METHYLPREDNISOLONE 4 MG PO TBPK: ORAL_TABLET | ORAL | 0 refills | Status: DC

## 2021-07-09 MED ORDER — MELOXICAM 15 MG PO TABS: 15.0000 mg | ORAL_TABLET | Freq: Every day | ORAL | 1 refills | Status: DC

## 2021-07-09 NOTE — Progress Notes (Signed)
PRN heel pain

## 2022-03-20 ENCOUNTER — Emergency Department (HOSPITAL_COMMUNITY): Payer: No Typology Code available for payment source

## 2022-03-20 ENCOUNTER — Other Ambulatory Visit: Payer: Self-pay

## 2022-03-20 ENCOUNTER — Encounter (HOSPITAL_COMMUNITY): Payer: Self-pay | Admitting: Pharmacy Technician

## 2022-03-20 ENCOUNTER — Inpatient Hospital Stay (HOSPITAL_COMMUNITY)
Admission: EM | Admit: 2022-03-20 | Discharge: 2022-03-22 | DRG: 247 | Disposition: A | Discharge status: Home / Self Care | Payer: No Typology Code available for payment source | Attending: Cardiovascular Disease | Admitting: Cardiovascular Disease

## 2022-03-20 DIAGNOSIS — I2510 Atherosclerotic heart disease of native coronary artery without angina pectoris: Secondary | ICD-10-CM | POA: Diagnosis not present

## 2022-03-20 DIAGNOSIS — E11649 Type 2 diabetes mellitus with hypoglycemia without coma: Secondary | ICD-10-CM | POA: Diagnosis not present

## 2022-03-20 DIAGNOSIS — G43909 Migraine, unspecified, not intractable, without status migrainosus: Secondary | ICD-10-CM | POA: Diagnosis present

## 2022-03-20 DIAGNOSIS — I10 Essential (primary) hypertension: Secondary | ICD-10-CM | POA: Diagnosis present

## 2022-03-20 DIAGNOSIS — Z833 Family history of diabetes mellitus: Secondary | ICD-10-CM

## 2022-03-20 DIAGNOSIS — E1165 Type 2 diabetes mellitus with hyperglycemia: Secondary | ICD-10-CM | POA: Diagnosis present

## 2022-03-20 DIAGNOSIS — Z79899 Other long term (current) drug therapy: Secondary | ICD-10-CM

## 2022-03-20 DIAGNOSIS — I214 Non-ST elevation (NSTEMI) myocardial infarction: Principal | ICD-10-CM | POA: Diagnosis present

## 2022-03-20 DIAGNOSIS — E785 Hyperlipidemia, unspecified: Secondary | ICD-10-CM | POA: Diagnosis present

## 2022-03-20 DIAGNOSIS — I25118 Atherosclerotic heart disease of native coronary artery with other forms of angina pectoris: Secondary | ICD-10-CM | POA: Diagnosis present

## 2022-03-20 DIAGNOSIS — R739 Hyperglycemia, unspecified: Secondary | ICD-10-CM

## 2022-03-20 LAB — APTT: aPTT: 24 seconds (ref 24–36)

## 2022-03-20 LAB — BASIC METABOLIC PANEL
Anion gap: 6 (ref 5–15)
BUN: 9 mg/dL (ref 6–20)
CO2: 25 mmol/L (ref 22–32)
Calcium: 8.8 mg/dL — ABNORMAL LOW (ref 8.9–10.3)
Chloride: 106 mmol/L (ref 98–111)
Creatinine, Ser: 1.08 mg/dL (ref 0.61–1.24)
GFR, Estimated: >60 mL/min (ref >60)
Glucose, Bld: 296 mg/dL — ABNORMAL HIGH (ref 70–99)
Potassium: 4 mmol/L (ref 3.5–5.1)
Sodium: 137 mmol/L (ref 135–145)

## 2022-03-20 LAB — CBG MONITORING, ED: Glucose-Capillary: 134 mg/dL — ABNORMAL HIGH (ref 70–99)

## 2022-03-20 LAB — CBC
HCT: 47.6 % (ref 39.0–52.0)
Hemoglobin: 16.3 g/dL (ref 13.0–17.0)
MCH: 31.6 pg (ref 26.0–34.0)
MCHC: 34.2 g/dL (ref 30.0–36.0)
MCV: 92.2 fL (ref 80.0–100.0)
Platelets: 268 10*3/uL (ref 150–400)
RBC: 5.16 MIL/uL (ref 4.22–5.81)
RDW: 12.7 % (ref 11.5–15.5)
WBC: 7 10*3/uL (ref 4.0–10.5)
nRBC: 0 % (ref 0.0–0.2)

## 2022-03-20 LAB — PROTIME-INR
INR: 1 (ref 0.8–1.2)
Prothrombin Time: 12.8 seconds (ref 11.4–15.2)

## 2022-03-20 LAB — TROPONIN I (HIGH SENSITIVITY)
Troponin I (High Sensitivity): 119 ng/L (ref <18)
Troponin I (High Sensitivity): 572 ng/L (ref <18)

## 2022-03-20 MED ORDER — SODIUM CHLORIDE 0.9% FLUSH
3.0000 mL | INTRAVENOUS | Status: DC | PRN
Start: 2022-03-20 — End: 2022-03-22

## 2022-03-20 MED ORDER — SODIUM CHLORIDE 0.9% FLUSH
3.0000 mL | Freq: Two times a day (BID) | INTRAVENOUS | Status: DC
  Administered 2022-03-22: 3 mL via INTRAVENOUS

## 2022-03-20 MED ORDER — NITROGLYCERIN 0.4 MG SL SUBL: 0.4000 mg | SUBLINGUAL_TABLET | SUBLINGUAL | Status: DC | PRN

## 2022-03-20 MED ORDER — HEPARIN BOLUS VIA INFUSION
4000.0000 [IU] | Freq: Once | INTRAVENOUS | Status: AC
  Administered 2022-03-20: 4000 [IU] via INTRAVENOUS
  Filled 2022-03-20: qty 4000

## 2022-03-20 MED ORDER — HEPARIN (PORCINE) 25000 UT/250ML-% IV SOLN
1150.0000 [IU]/h | INTRAVENOUS | Status: DC
  Administered 2022-03-20: 850 [IU]/h via INTRAVENOUS
  Filled 2022-03-20: qty 250

## 2022-03-20 MED ORDER — NITROGLYCERIN IN D5W 200-5 MCG/ML-% IV SOLN
0.0000 ug/min | INTRAVENOUS | Status: DC
  Administered 2022-03-20: 5 ug/min via INTRAVENOUS
  Filled 2022-03-20: qty 250

## 2022-03-20 MED ORDER — SODIUM CHLORIDE 0.9 % IV BOLUS
1000.0000 mL | Freq: Once | INTRAVENOUS | Status: AC
  Administered 2022-03-20: 1000 mL via INTRAVENOUS

## 2022-03-20 MED ORDER — ACETAMINOPHEN 325 MG PO TABS
650.0000 mg | ORAL_TABLET | ORAL | Status: DC | PRN
  Administered 2022-03-21 (×3): 650 mg via ORAL
  Filled 2022-03-20 (×3): qty 2

## 2022-03-20 MED ORDER — ASPIRIN EC 81 MG PO TBEC
81.0000 mg | DELAYED_RELEASE_TABLET | Freq: Every day | ORAL | Status: DC
  Administered 2022-03-21 – 2022-03-22 (×2): 81 mg via ORAL
  Filled 2022-03-20 (×2): qty 1

## 2022-03-20 MED ORDER — ASPIRIN 81 MG PO CHEW: 324.0000 mg | CHEWABLE_TABLET | Freq: Once | ORAL | Status: AC

## 2022-03-20 MED ORDER — ATORVASTATIN CALCIUM 80 MG PO TABS
80.0000 mg | ORAL_TABLET | Freq: Every day | ORAL | Status: DC
  Administered 2022-03-21 – 2022-03-22 (×2): 80 mg via ORAL
  Filled 2022-03-20 (×2): qty 1

## 2022-03-20 MED ORDER — IOHEXOL 350 MG/ML SOLN
65.0000 mL | Freq: Once | INTRAVENOUS | Status: AC | PRN
  Administered 2022-03-20: 65 mL via INTRAVENOUS

## 2022-03-20 MED ORDER — INSULIN ASPART 100 UNIT/ML IJ SOLN: 0.0000 [IU] | Freq: Three times a day (TID) | INTRAMUSCULAR | Status: DC

## 2022-03-20 MED ORDER — METOPROLOL SUCCINATE ER 25 MG PO TB24
25.0000 mg | ORAL_TABLET | Freq: Every day | ORAL | Status: DC
  Administered 2022-03-21 – 2022-03-22 (×2): 25 mg via ORAL
  Filled 2022-03-20 (×2): qty 1

## 2022-03-20 MED ORDER — ASPIRIN 81 MG PO CHEW
CHEWABLE_TABLET | ORAL | Status: AC
  Administered 2022-03-20: 324 mg via ORAL
  Filled 2022-03-20: qty 4

## 2022-03-20 MED ORDER — SODIUM CHLORIDE 0.9 % IV SOLN: 250.0000 mL | INTRAVENOUS | Status: DC | PRN

## 2022-03-20 MED ORDER — ONDANSETRON HCL 4 MG/2ML IJ SOLN: 4.0000 mg | Freq: Four times a day (QID) | INTRAMUSCULAR | Status: DC | PRN

## 2022-03-20 MED ORDER — SODIUM CHLORIDE 0.9 % IV SOLN: INTRAVENOUS | Status: DC

## 2022-03-20 MED ORDER — ALPRAZOLAM 0.5 MG PO TABS
0.5000 mg | ORAL_TABLET | Freq: Every day | ORAL | Status: DC
  Administered 2022-03-21 (×2): 0.5 mg via ORAL
  Filled 2022-03-20: qty 1
  Filled 2022-03-20: qty 2

## 2022-03-20 MED ORDER — INSULIN ASPART 100 UNIT/ML IJ SOLN: 4.0000 [IU] | Freq: Three times a day (TID) | INTRAMUSCULAR | Status: DC

## 2022-03-20 NOTE — ED Provider Triage Note (Signed)
Emergency Medicine Provider Triage Evaluation Note ? ?Bryan Adams , a 41 y.o. male  was evaluated in triage.  Pt complains of chest pain.  Patient states that he has had intermittent chest pain over the past 2 weeks.  Today it began approximately 4 to 5 hours ago and has been persistent.  Describes as feeling a "bubble "in the center of his chest.  Endorses radiation of symptoms to the left arm and left side of the neck.  Patient states he feels slightly short of breath with the chest pain but feels that that may be his way of trying to hold his breath while the pain hits.  Rates pain at 4 out of 10.  [facility] and plans to work that up outpatient at a later date.  Recommended emergency evaluation at this time.  Denies abdominal pain, nausea, vomiting ? ?Review of Systems  ?Positive: Chest pain ?Negative: Nausea, vomiting ? ?Physical Exam  ?BP (!) 160/87 (BP Location: Right Arm)   Pulse 96   Temp 98.9 ?F (37.2 ?C) (Oral)   Resp 18   SpO2 98%  ?Gen:   Awake, no distress   ?Resp:  Normal effort  ?MSK:   Moves extremities without difficulty  ?Other:   ? ?Medical Decision Making  ?Medically screening exam initiated at 5:42 PM.  Appropriate orders placed.  Bryan Adams was informed that the remainder of the evaluation will be completed by another provider, this initial triage assessment does not replace that evaluation, and the importance of remaining in the ED until their evaluation is complete. ? ? ?  ?Darrick Grinder, PA-C ?03/20/22 1743 ? ?

## 2022-03-20 NOTE — ED Notes (Addendum)
Lab called for critical trop 119. ?

## 2022-03-20 NOTE — ED Notes (Signed)
Name called for room and no answer  ?

## 2022-03-20 NOTE — ED Triage Notes (Signed)
Pt here with Central chest pain with intermittent radiation to L arm or L neck. Pt states pain has been intermittent over the last few weeks. Pt with shob as well.  ?

## 2022-03-20 NOTE — H&P (Signed)
Referring Physician: Ralph LeydenBritni Henderly, PA ? ?Bryan Adams is an 41 y.o. male.                       ?Chief Complaint: Chest pain ? ?HPI: 41 years old white male with untreated type 2 DM and migraine headache has 1 day h/o of left sided, dull chest pain radiating to left arm with shortness of breath. He had similar episode 2 weeks ago that did not last as long. In ER he is on IV NTG and Heparin and as such pain is down to 2/10. His troponin has upward trend with initial level of 119 and repeat level of 572 ng. EKG is sinus rhythm. Chest x-ray is left lower lobe atelectasis and CT angio chest is negative for PE. ? ?Past Medical History:  ?Diagnosis Date  ? Headache   ?  ? ? ?History reviewed. No pertinent surgical history. ? ?Family History  ?Problem Relation Age of Onset  ? Diabetes Mellitus II Father   ? ?Social History:  reports that he has never smoked. He has never used smokeless tobacco. He reports that he does not drink alcohol and does not use drugs. ? ?Allergies: No Known Allergies ? ?(Not in a hospital admission) ? ? ?Results for orders placed or performed during the hospital encounter of 03/20/22 (from the past 48 hour(s))  ?Basic metabolic panel     Status: Abnormal  ? Collection Time: 03/20/22  5:45 PM  ?Result Value Ref Range  ? Sodium 137 135 - 145 mmol/L  ? Potassium 4.0 3.5 - 5.1 mmol/L  ? Chloride 106 98 - 111 mmol/L  ? CO2 25 22 - 32 mmol/L  ? Glucose, Bld 296 (H) 70 - 99 mg/dL  ?  Comment: Glucose reference range applies only to samples taken after fasting for at least 8 hours.  ? BUN 9 6 - 20 mg/dL  ? Creatinine, Ser 1.08 0.61 - 1.24 mg/dL  ? Calcium 8.8 (L) 8.9 - 10.3 mg/dL  ? GFR, Estimated >60 >60 mL/min  ?  Comment: (NOTE) ?Calculated using the CKD-EPI Creatinine Equation (2021) ?  ? Anion gap 6 5 - 15  ?  Comment: Performed at [facility], [address] [place], [place] [address]  ?CBC     Status: None  ? Collection Time: 03/20/22  5:45 PM  ?Result Value Ref Range  ? WBC 7.0  4.0 - 10.5 K/uL  ? RBC 5.16 4.22 - 5.81 MIL/uL  ? Hemoglobin 16.3 13.0 - 17.0 g/dL  ? HCT 47.6 39.0 - 52.0 %  ? MCV 92.2 80.0 - 100.0 fL  ? MCH 31.6 26.0 - 34.0 pg  ? MCHC 34.2 30.0 - 36.0 g/dL  ? RDW 12.7 11.5 - 15.5 %  ? Platelets 268 150 - 400 K/uL  ? nRBC 0.0 0.0 - 0.2 %  ?  Comment: Performed at [facility], [address] [place], [place] [address]  ?Troponin I (High Sensitivity)     Status: Abnormal  ? Collection Time: 03/20/22  5:45 PM  ?Result Value Ref Range  ? Troponin I (High Sensitivity) 119 (HH) <18 ng/L  ?  Comment: CRITICAL RESULT CALLED TO, READ BACK BY AND VERIFIED WITH: ?Cristina GongK WATLINGTON RN 1902 03/20/2022 BY R VERAAR ?(NOTE) ?Elevated high sensitivity troponin I (hsTnI) values and significant  ?changes across serial measurements may suggest ACS but many other  ?chronic and acute conditions are known to elevate hsTnI results.  ?Refer  to the Links section for chest pain algorithms and additional  ?guidance. ?Performed at [facility], [address], [place], [place] ?[address] ?  ?Troponin I (High Sensitivity)     Status: Abnormal  ? Collection Time: 03/20/22  8:04 PM  ?Result Value Ref Range  ? Troponin I (High Sensitivity) 572 (HH) <18 ng/L  ?  Comment: CRITICAL VALUE NOTED.  VALUE IS CONSISTENT WITH PREVIOUSLY REPORTED AND CALLED VALUE. ?(NOTE) ?Elevated high sensitivity troponin I (hsTnI) values and significant  ?changes across serial measurements may suggest ACS but many other  ?chronic and acute conditions are known to elevate hsTnI results.  ?Refer to the Links section for chest pain algorithms and additional  ?guidance. ?Performed at [facility], [address] [place], [place] ?[address] ?  ?Protime-INR     Status: None  ? Collection Time: 03/20/22  9:30 PM  ?Result Value Ref Range  ? Prothrombin Time 12.8 11.4 - 15.2 seconds  ? INR 1.0 0.8 - 1.2  ?  Comment: (NOTE) ?INR goal varies based on device and disease states. ?Performed at [facility], [address], [place], [place] ?[address] ?  ?APTT     Status: None  ? Collection Time: 03/20/22  9:30 PM  ?Result Value Ref Range  ? aPTT 24 24 - 36 seconds  ?  Comment: Performed at [facility], [address], [place], [place] [address]  ?CBG monitoring, ED     Status: Abnormal  ? Collection Time: 03/20/22  9:47 PM  ?Result Value Ref Range  ? Glucose-Capillary 134 (H) 70 - 99 mg/dL  ?  Comment: Glucose reference range applies only to samples taken after fasting for at least 8 hours.  ? Comment 1 Notify RN   ? Comment 2 Document in Chart   ? ?DG Chest 2 View ? ?Result Date: 03/20/2022 ?CLINICAL DATA:  Chest pain EXAM: CHEST - 2 VIEW COMPARISON:  August 15, 2019 FINDINGS: The heart size and mediastinal contours are within normal limits. Streaky opacity in the retrocardiac left lower lobe may reflect atelectasis or consolidation. No pleural effusion. No pneumothorax. The visualized skeletal structures are unremarkable. IMPRESSION: Streaky opacity in the retrocardiac left lower lobe may reflect atelectasis or consolidation. Electronically Signed   By: Maudry Mayhew M.D.   On: 03/20/2022 18:55  ? ?CT Angio Chest PE W/Cm &/Or Wo Cm ? ?Result Date: 03/20/2022 ?CLINICAL DATA:  Concern for pulmonary embolism. EXAM: CT ANGIOGRAPHY CHEST WITH CONTRAST TECHNIQUE: Multidetector CT imaging of the chest was performed using the standard protocol during bolus administration of intravenous contrast. Multiplanar CT image reconstructions and MIPs were obtained to evaluate the vascular anatomy. RADIATION DOSE REDUCTION: This exam was performed according to the departmental dose-optimization program which includes automated exposure control, adjustment of the mA and/or kV according to patient size and/or use of iterative reconstruction technique. CONTRAST:  69mL OMNIPAQUE IOHEXOL 350 MG/ML SOLN COMPARISON:  Chest radiograph dated 03/20/2022. FINDINGS: Cardiovascular: There is no cardiomegaly or pericardial effusion. The thoracic aorta is  unremarkable. The origins of the great vessels of the aortic arch appear patent. Evaluation of the pulmonary arteries is limited due to respiratory motion artifact. No central pulmonary artery embolus identified. Mediastinum/Nodes: No hilar or mediastinal adenopathy. The esophagus is grossly unremarkable. No mediastinal fluid collection. Lungs/Pleura: Left lung base linear atelectasis/scarring. No focal consolidation, pleural effusion, or pneumothorax. The central airways are patent. Upper Abdomen: No acute abnormality. Musculoskeletal: No acute osseous pathology. Bilateral gynecomastia.  Review of the MIP images confirms the above findings. IMPRESSION: No acute intrathoracic pathology. No CT evidence of central pulmonary artery embolus. Electronically Signed   By: Elgie Collard M.D.   On: 03/20/2022 21:14   ? ?Review Of Systems ?Constitutional: No fever, chills, weight loss or gain. ?Eyes: No vision change, wears glasses. No discharge or pain. ?Ears: No hearing loss, No tinnitus. ?Respiratory: No asthma, COPD, pneumonias. No shortness of breath. No hemoptysis. ?Cardiovascular: Recent chest pain, no palpitation, leg edema. ?Gastrointestinal: No nausea, vomiting, diarrhea, constipation. No GI bleed. No hepatitis. ?Genitourinary: No dysuria, hematuria, kidney stone. No incontinance. ?Neurological: No headache, stroke, seizures.  ?Psychiatry: No psych facility admission for anxiety, depression, suicide. No detox. ?Skin: No rash. ?Musculoskeletal: No joint pain, fibromyalgia. No neck pain, back pain. ?Lymphadenopathy: No lymphadenopathy. ?Hematology: No anemia or easy bruising. ? ? ?Blood pressure (!) 130/108, pulse 77, temperature 98.9 ?F (37.2 ?C), temperature source Oral, resp. rate 15, SpO2 96 %. There is no height or weight on file to calculate BMI. ?General appearance: alert, cooperative, appears stated age and mild distress ?Head: Normocephalic, atraumatic. ?Eyes: Brown eyes, pink conjunctiva, corneas clear.   ?Neck: No adenopathy, no carotid bruit, no JVD, supple, symmetrical, trachea midline and thyroid not enlarged. ?Resp: Clear to auscultation bilaterally. ?Cardio: Regular rate and rhythm, S1, S2 normal, II/VI

## 2022-03-20 NOTE — ED Notes (Signed)
Admit provider at bedside 

## 2022-03-20 NOTE — Progress Notes (Signed)
ANTICOAGULATION CONSULT NOTE - Initial Consult ? ?Pharmacy Consult for IV heparin ?Indication: chest pain/ACS ? ?No Known Allergies ? ?Patient Measurements: ?Weight obtained per patient report: 95.9 kg  ?Heparin Dosing Weight: 70 kg  ? ?Vital Signs: ?Temp: 98.9 ?F (37.2 ?C) (04/20 1741) ?Temp Source: Oral (04/20 1741) ?BP: 135/79 (04/20 2045) ?Pulse Rate: 77 (04/20 2115) ? ?Labs: ?Recent Labs  ?  03/20/22 ?1745 03/20/22 ?2004  ?HGB 16.3  --   ?HCT 47.6  --   ?PLT 268  --   ?CREATININE 1.08  --   ?TROPONINIHS 119* 572*  ? ? ?CrCl cannot be calculated (Unknown ideal weight.). ? ? ?Medical History: ?Past Medical History:  ?Diagnosis Date  ? Headache   ? ?Assessment: ?41 yo male presenting with left-sided and central chest pain for the past 2 weeks. Pharmacy has now been consulted to start heparin. ? ?CBC is within normal limits. ? ?Goal of Therapy:  ?Heparin level 0.3-0.7 units/ml ?Monitor platelets by anticoagulation protocol: Yes ?  ?Plan:  ?Heparin 4000 units IV x1 followed by heparin 850 units/hr ?8h heparin level  ?Monitor s/sx of bleeding  ? ?Thank you for including pharmacy in the care of this patient. ? ?Lissa Merlin, PharmD ?PGY1 Acute Care Pharmacy Resident  ?Phone: [phone] ?03/20/2022  9:47 PM ? ?Please check [email or web address] for unit-specific pharmacy phone numbers. ? ? ?

## 2022-03-20 NOTE — ED Notes (Signed)
Pt off unit to CT

## 2022-03-20 NOTE — ED Provider Notes (Signed)
?[facility] EMERGENCY DEPARTMENT ?Provider Note ? ? ?CSN: [id number] ?Arrival date & time: 03/20/22  1734 ? ?  ?History ? ?Chief Complaint  ?Patient presents with  ? Chest Pain  ? ? ?Bryan EvesMichael A Adams is a 41 y.o. male here for evaluation of chest pain.  Has had intermittent chest pain over the last 2 years which subsequently resolved.  He was seen in the ED at that time with reassuring WU.  Over the last 2 weeks he has noted left-sided and central chest pain.  Occasionally radiates to his left arm and jaw.  States has been able to lift weights at home however does get pain when he goes up hills.  He feels like he has some associated shortness of breath.  His pain is also pleuritic in nature.  No history of PE or DVT.  No back pain.  No recent surgery, immobilization or malignancy.  [facility], sent here for further evaluation.  Noted HTN at prior visits however no meds per Patient. Specifically brought him in today as chest pain typically is fleeting however he has had persistent pain since about 4 PM this afternoon.  Initially rated his pain a 6 out of 10, currently 4/10.  No diaphoresis, nausea or vomiting.  Not currently followed by cardiology. ? ?HPI ? ?  ? ?Home Medications ?Prior to Admission medications   ?Medication Sig Start Date End Date Taking? Authorizing Provider  ?albuterol (PROVENTIL HFA;VENTOLIN HFA) 108 (90 Base) MCG/ACT inhaler Inhale 2 puffs into the lungs every 6 (six) hours as needed for wheezing or shortness of breath. ?Patient not taking: Reported on 03/20/2022 08/14/18   Myrtie NeitherUgah, Nwannadiya, MD  ?cefpodoxime (VANTIN) 200 MG tablet Take 1 tablet (200 mg total) by mouth 2 (two) times daily. ?Patient not taking: Reported on 03/20/2022 08/11/18   Terrilee FilesButler, Lux C, MD  ?HYDROcodone-acetaminophen (NORCO/VICODIN) 5-325 MG tablet Take 1-2 tablets by mouth every 4 (four) hours as needed for severe pain. ?Patient not taking: Reported on 03/20/2022 08/11/18   Terrilee FilesButler,  Srihith C, MD  ?meloxicam (MOBIC) 15 MG tablet Take 1 tablet (15 mg total) by mouth daily. ?Patient not taking: Reported on 03/20/2022 07/09/21   Felecia ShellingEvans, Brent M, DPM  ?methylPREDNISolone (MEDROL DOSEPAK) 4 MG TBPK tablet 6 day dose pack - take as directed ?Patient not taking: Reported on 03/20/2022 07/09/21   Felecia ShellingEvans, Brent M, DPM  ?predniSONE (STERAPRED UNI-PAK 21 TAB) 10 MG (21) TBPK tablet Take by mouth daily. 30 mg daily x2 day then 20 mg daily x2 THEN 10 mg daily x2  THEN5 mg daily x2 ?Patient not taking: Reported on 03/20/2022 08/14/18   Myrtie NeitherUgah, Nwannadiya, MD  ?SUMAtriptan (IMITREX) 50 MG tablet Take 1 tablet (50 mg total) by mouth 2 (two) times daily as needed for migraine or headache (unresponsive to Excedrin). May repeat in 2 hours if headache persists or recurs. ?Patient not taking: Reported on 03/20/2022 08/14/18   Myrtie NeitherUgah, Nwannadiya, MD  ?   ? ?Allergies    ?Patient has no known allergies.   ? ?Review of Systems   ?Review of Systems  ?Constitutional: Negative.   ?HENT: Negative.    ?Respiratory:  Positive for chest tightness and shortness of breath. Negative for cough, choking, wheezing and stridor.   ?Cardiovascular:  Positive for chest pain. Negative for palpitations and leg swelling.  ?Gastrointestinal: Negative.   ?Genitourinary: Negative.   ?Musculoskeletal: Negative.   ?Skin: Negative.   ?Neurological: Negative.   ?All other systems reviewed and  are negative. ? ?Physical Exam ?Updated Vital Signs ?BP (!) 147/93   Pulse 75   Temp 98.9 ?F (37.2 ?C) (Oral)   Resp 13   SpO2 97%  ?Physical Exam ?Vitals and nursing note reviewed.  ?Constitutional:   ?   General: He is not in acute distress. ?   Appearance: He is well-developed. He is not ill-appearing, toxic-appearing or diaphoretic.  ?HENT:  ?   Head: Atraumatic.  ?Eyes:  ?   Pupils: Pupils are equal, round, and reactive to light.  ?Cardiovascular:  ?   Rate and Rhythm: Normal rate and regular rhythm.  ?   Pulses:     ?     Radial pulses are 2+ on the right side  and 2+ on the left side.  ?   Heart sounds: Normal heart sounds.  ?Pulmonary:  ?   Effort: Pulmonary effort is normal. No respiratory distress.  ?   Breath sounds: Normal breath sounds.  ?Chest:  ?   Chest wall: No mass or tenderness.  ?Abdominal:  ?   General: Bowel sounds are normal. There is no distension.  ?   Palpations: Abdomen is soft.  ?Musculoskeletal:     ?   General: Normal range of motion.  ?   Cervical back: Normal range of motion and neck supple.  ?   Right lower leg: No tenderness. No edema.  ?   Left lower leg: No tenderness. No edema.  ?Skin: ?   General: Skin is warm and dry.  ?   Capillary Refill: Capillary refill takes less than 2 seconds.  ?Neurological:  ?   General: No focal deficit present.  ?   Mental Status: He is alert and oriented to person, place, and time.  ? ? ?ED Results / Procedures / Treatments   ?Labs ?(all labs ordered are listed, but only abnormal results are displayed) ?Labs Reviewed  ?BASIC METABOLIC PANEL - Abnormal; Notable for the following components:  ?    Result Value  ? Glucose, Bld 296 (*)   ? Calcium 8.8 (*)   ? All other components within normal limits  ?CBG MONITORING, ED - Abnormal; Notable for the following components:  ? Glucose-Capillary 134 (*)   ? All other components within normal limits  ?TROPONIN I (HIGH SENSITIVITY) - Abnormal; Notable for the following components:  ? Troponin I (High Sensitivity) 119 (*)   ? All other components within normal limits  ?TROPONIN I (HIGH SENSITIVITY) - Abnormal; Notable for the following components:  ? Troponin I (High Sensitivity) 572 (*)   ? All other components within normal limits  ?CBC  ?PROTIME-INR  ?APTT  ?HEMOGLOBIN A1C  ?HEPARIN LEVEL (UNFRACTIONATED)  ?CBC  ? ? ?EKG ?EKG Interpretation ? ?Date/Time:  Thursday March 20 2022 17:40:32 EDT ?Ventricular Rate:  99 ?PR Interval:  164 ?QRS Duration: 94 ?QT Interval:  350 ?QTC Calculation: 449 ?R Axis:   84 ?Text Interpretation: Normal sinus rhythm Normal ECG No significant  change since last tracing Confirmed by Melene Plan [phone]) on 03/20/2022 7:21:15 PM ? ?Radiology ?DG Chest 2 View ? ?Result Date: 03/20/2022 ?CLINICAL DATA:  Chest pain EXAM: CHEST - 2 VIEW COMPARISON:  August 15, 2019 FINDINGS: The heart size and mediastinal contours are within normal limits. Streaky opacity in the retrocardiac left lower lobe may reflect atelectasis or consolidation. No pleural effusion. No pneumothorax. The visualized skeletal structures are unremarkable. IMPRESSION: Streaky opacity in the retrocardiac left lower lobe may reflect atelectasis or consolidation. Electronically Signed  By: Maudry Mayhew M.D.   On: 03/20/2022 18:55  ? ?CT Angio Chest PE W/Cm &/Or Wo Cm ? ?Result Date: 03/20/2022 ?CLINICAL DATA:  Concern for pulmonary embolism. EXAM: CT ANGIOGRAPHY CHEST WITH CONTRAST TECHNIQUE: Multidetector CT imaging of the chest was performed using the standard protocol during bolus administration of intravenous contrast. Multiplanar CT image reconstructions and MIPs were obtained to evaluate the vascular anatomy. RADIATION DOSE REDUCTION: This exam was performed according to the departmental dose-optimization program which includes automated exposure control, adjustment of the mA and/or kV according to patient size and/or use of iterative reconstruction technique. CONTRAST:  23mL OMNIPAQUE IOHEXOL 350 MG/ML SOLN COMPARISON:  Chest radiograph dated 03/20/2022. FINDINGS: Cardiovascular: There is no cardiomegaly or pericardial effusion. The thoracic aorta is unremarkable. The origins of the great vessels of the aortic arch appear patent. Evaluation of the pulmonary arteries is limited due to respiratory motion artifact. No central pulmonary artery embolus identified. Mediastinum/Nodes: No hilar or mediastinal adenopathy. The esophagus is grossly unremarkable. No mediastinal fluid collection. Lungs/Pleura: Left lung base linear atelectasis/scarring. No focal consolidation, pleural effusion, or  pneumothorax. The central airways are patent. Upper Abdomen: No acute abnormality. Musculoskeletal: No acute osseous pathology. Bilateral gynecomastia. Review of the MIP images confirms the above findings. IMPRESSION

## 2022-03-20 NOTE — ED Notes (Signed)
Received verbal report from Julie B RN at this time 

## 2022-03-21 ENCOUNTER — Other Ambulatory Visit (HOSPITAL_COMMUNITY): Payer: No Typology Code available for payment source

## 2022-03-21 ENCOUNTER — Inpatient Hospital Stay (HOSPITAL_COMMUNITY): Payer: No Typology Code available for payment source

## 2022-03-21 ENCOUNTER — Encounter (HOSPITAL_COMMUNITY)
Admission: EM | Disposition: A | Discharge status: Home / Self Care | Payer: Self-pay | Source: Home / Self Care | Attending: Cardiovascular Disease

## 2022-03-21 DIAGNOSIS — I214 Non-ST elevation (NSTEMI) myocardial infarction: Secondary | ICD-10-CM

## 2022-03-21 DIAGNOSIS — I2510 Atherosclerotic heart disease of native coronary artery without angina pectoris: Secondary | ICD-10-CM

## 2022-03-21 HISTORY — PX: CORONARY STENT INTERVENTION: CATH118234

## 2022-03-21 HISTORY — PX: LEFT HEART CATH AND CORONARY ANGIOGRAPHY: CATH118249

## 2022-03-21 LAB — ECHOCARDIOGRAM COMPLETE
AR max vel: 2.29 cm2
AV Peak grad: 13.1 mmHg
Ao pk vel: 1.81 m/s
Area-P 1/2: 3.77 cm2
Calc EF: 61.6 %
Height: 67 in
P 1/2 time: 699 msec
S' Lateral: 3.5 cm
Single Plane A2C EF: 59.4 %
Single Plane A4C EF: 62.2 %
Weight: 3360 oz

## 2022-03-21 LAB — BASIC METABOLIC PANEL
Anion gap: 7 (ref 5–15)
BUN: 7 mg/dL (ref 6–20)
CO2: 25 mmol/L (ref 22–32)
Calcium: 8.7 mg/dL — ABNORMAL LOW (ref 8.9–10.3)
Chloride: 108 mmol/L (ref 98–111)
Creatinine, Ser: 0.99 mg/dL (ref 0.61–1.24)
GFR, Estimated: >60 mL/min (ref >60)
Glucose, Bld: 64 mg/dL — ABNORMAL LOW (ref 70–99)
Potassium: 3.8 mmol/L (ref 3.5–5.1)
Sodium: 140 mmol/L (ref 135–145)

## 2022-03-21 LAB — LIPID PANEL
Cholesterol: 238 mg/dL — ABNORMAL HIGH (ref 0–200)
HDL: 28 mg/dL — ABNORMAL LOW (ref >40)
LDL Cholesterol: 193 mg/dL — ABNORMAL HIGH (ref 0–99)
Total CHOL/HDL Ratio: 8.5 RATIO
Triglycerides: 83 mg/dL (ref <150)
VLDL: 17 mg/dL (ref 0–40)

## 2022-03-21 LAB — CBC
HCT: 48.5 % (ref 39.0–52.0)
Hemoglobin: 16.5 g/dL (ref 13.0–17.0)
MCH: 31.3 pg (ref 26.0–34.0)
MCHC: 34 g/dL (ref 30.0–36.0)
MCV: 91.9 fL (ref 80.0–100.0)
Platelets: 250 10*3/uL (ref 150–400)
RBC: 5.28 MIL/uL (ref 4.22–5.81)
RDW: 12.8 % (ref 11.5–15.5)
WBC: 6.8 10*3/uL (ref 4.0–10.5)
nRBC: 0 % (ref 0.0–0.2)

## 2022-03-21 LAB — HEMOGLOBIN A1C
Hgb A1c MFr Bld: 7.1 % — ABNORMAL HIGH (ref 4.8–5.6)
Mean Plasma Glucose: 157.07 mg/dL

## 2022-03-21 LAB — GLUCOSE, CAPILLARY
Glucose-Capillary: 71 mg/dL (ref 70–99)
Glucose-Capillary: 102 mg/dL — ABNORMAL HIGH (ref 70–99)
Glucose-Capillary: 71 mg/dL (ref 70–99)
Glucose-Capillary: 141 mg/dL — ABNORMAL HIGH (ref 70–99)

## 2022-03-21 LAB — POCT ACTIVATED CLOTTING TIME
Activated Clotting Time: 221 seconds
Activated Clotting Time: 263 seconds

## 2022-03-21 LAB — HIV ANTIBODY (ROUTINE TESTING W REFLEX): HIV Screen 4th Generation wRfx: NONREACTIVE

## 2022-03-21 LAB — HEPARIN LEVEL (UNFRACTIONATED): Heparin Unfractionated: <0.1 IU/mL — ABNORMAL LOW (ref 0.30–0.70)

## 2022-03-21 LAB — CBG MONITORING, ED: Glucose-Capillary: 73 mg/dL (ref 70–99)

## 2022-03-21 SURGERY — LEFT HEART CATH AND CORONARY ANGIOGRAPHY
Anesthesia: LOCAL

## 2022-03-21 MED ORDER — HEPARIN SODIUM (PORCINE) 1000 UNIT/ML IJ SOLN
INTRAMUSCULAR | Status: DC | PRN
  Administered 2022-03-21 (×2): 5000 [IU] via INTRAVENOUS

## 2022-03-21 MED ORDER — TICAGRELOR 90 MG PO TABS
ORAL_TABLET | ORAL | Status: AC
  Filled 2022-03-21: qty 2

## 2022-03-21 MED ORDER — SODIUM CHLORIDE 0.9 % IV SOLN: 250.0000 mL | INTRAVENOUS | Status: DC | PRN

## 2022-03-21 MED ORDER — LABETALOL HCL 5 MG/ML IV SOLN: 10.0000 mg | INTRAVENOUS | Status: AC | PRN

## 2022-03-21 MED ORDER — SODIUM CHLORIDE 0.9% FLUSH
3.0000 mL | Freq: Two times a day (BID) | INTRAVENOUS | Status: DC
  Administered 2022-03-21 – 2022-03-22 (×2): 3 mL via INTRAVENOUS

## 2022-03-21 MED ORDER — SODIUM CHLORIDE 0.9% FLUSH: 3.0000 mL | INTRAVENOUS | Status: DC | PRN

## 2022-03-21 MED ORDER — NITROGLYCERIN 1 MG/10 ML FOR IR/CATH LAB
INTRA_ARTERIAL | Status: DC | PRN
  Administered 2022-03-21: 200 ug via INTRACORONARY
  Administered 2022-03-21: 150 ug via INTRACORONARY

## 2022-03-21 MED ORDER — SODIUM CHLORIDE 0.9 % IV SOLN: INTRAVENOUS | Status: DC

## 2022-03-21 MED ORDER — TICAGRELOR 90 MG PO TABS
90.0000 mg | ORAL_TABLET | Freq: Two times a day (BID) | ORAL | Status: DC
  Administered 2022-03-21 – 2022-03-22 (×2): 90 mg via ORAL
  Filled 2022-03-21 (×2): qty 1

## 2022-03-21 MED ORDER — HEPARIN (PORCINE) IN NACL 1000-0.9 UT/500ML-% IV SOLN
INTRAVENOUS | Status: AC
  Filled 2022-03-21: qty 1000

## 2022-03-21 MED ORDER — LISINOPRIL 5 MG PO TABS
5.0000 mg | ORAL_TABLET | Freq: Every day | ORAL | Status: DC
  Administered 2022-03-21 – 2022-03-22 (×2): 5 mg via ORAL
  Filled 2022-03-21 (×2): qty 1

## 2022-03-21 MED ORDER — HEPARIN (PORCINE) IN NACL 1000-0.9 UT/500ML-% IV SOLN
INTRAVENOUS | Status: DC | PRN
  Administered 2022-03-21 (×2): 500 mL

## 2022-03-21 MED ORDER — IOHEXOL 350 MG/ML SOLN
INTRAVENOUS | Status: DC | PRN
  Administered 2022-03-21: 90 mL

## 2022-03-21 MED ORDER — MIDAZOLAM HCL 2 MG/2ML IJ SOLN
INTRAMUSCULAR | Status: DC | PRN
  Administered 2022-03-21 (×3): 1 mg via INTRAVENOUS

## 2022-03-21 MED ORDER — VERAPAMIL HCL 2.5 MG/ML IV SOLN
INTRAVENOUS | Status: AC
  Filled 2022-03-21: qty 2

## 2022-03-21 MED ORDER — MIDAZOLAM HCL 2 MG/2ML IJ SOLN
INTRAMUSCULAR | Status: DC | PRN
Start: 2022-03-21 — End: 2022-03-21
  Administered 2022-03-21: 1 mg via INTRAVENOUS

## 2022-03-21 MED ORDER — MIDAZOLAM HCL 2 MG/2ML IJ SOLN
INTRAMUSCULAR | Status: AC
  Filled 2022-03-21: qty 2

## 2022-03-21 MED ORDER — HEPARIN SODIUM (PORCINE) 1000 UNIT/ML IJ SOLN
INTRAMUSCULAR | Status: DC | PRN
  Administered 2022-03-21: 5000 [IU] via INTRAVENOUS

## 2022-03-21 MED ORDER — FENTANYL CITRATE (PF) 100 MCG/2ML IJ SOLN
INTRAMUSCULAR | Status: AC
Start: 2022-03-21 — End: ?
  Filled 2022-03-21: qty 2

## 2022-03-21 MED ORDER — VERAPAMIL HCL 2.5 MG/ML IV SOLN
INTRAVENOUS | Status: DC | PRN
  Administered 2022-03-21: 10 mL via INTRA_ARTERIAL

## 2022-03-21 MED ORDER — HEPARIN BOLUS VIA INFUSION
2000.0000 [IU] | Freq: Once | INTRAVENOUS | Status: AC
  Administered 2022-03-21: 2000 [IU] via INTRAVENOUS
  Filled 2022-03-21: qty 2000

## 2022-03-21 MED ORDER — FENTANYL CITRATE (PF) 100 MCG/2ML IJ SOLN
INTRAMUSCULAR | Status: DC | PRN
  Administered 2022-03-21: 50 ug via INTRAVENOUS

## 2022-03-21 MED ORDER — NITROGLYCERIN 1 MG/10 ML FOR IR/CATH LAB
INTRA_ARTERIAL | Status: AC
  Filled 2022-03-21: qty 10

## 2022-03-21 MED ORDER — LIDOCAINE HCL (PF) 1 % IJ SOLN
INTRAMUSCULAR | Status: DC | PRN
  Administered 2022-03-21: 2 mL via INTRADERMAL

## 2022-03-21 MED ORDER — SODIUM CHLORIDE 0.9 % WEIGHT BASED INFUSION: 1.0000 mL/kg/h | INTRAVENOUS | Status: AC

## 2022-03-21 MED ORDER — HEPARIN SODIUM (PORCINE) 1000 UNIT/ML IJ SOLN
INTRAMUSCULAR | Status: AC
  Filled 2022-03-21: qty 10

## 2022-03-21 MED ORDER — IOHEXOL 350 MG/ML SOLN
INTRAVENOUS | Status: DC | PRN
  Administered 2022-03-21: 70 mL

## 2022-03-21 MED ORDER — TICAGRELOR 90 MG PO TABS
ORAL_TABLET | ORAL | Status: DC | PRN
  Administered 2022-03-21: 180 mg via ORAL

## 2022-03-21 MED ORDER — SODIUM CHLORIDE 0.9% FLUSH: 3.0000 mL | Freq: Two times a day (BID) | INTRAVENOUS | Status: DC

## 2022-03-21 MED ORDER — ASPIRIN 81 MG PO CHEW: 81.0000 mg | CHEWABLE_TABLET | ORAL | Status: DC

## 2022-03-21 MED ORDER — HYDRALAZINE HCL 20 MG/ML IJ SOLN: 10.0000 mg | INTRAMUSCULAR | Status: AC | PRN

## 2022-03-21 MED ORDER — LIDOCAINE HCL (PF) 1 % IJ SOLN
INTRAMUSCULAR | Status: AC
  Filled 2022-03-21: qty 30

## 2022-03-21 MED ORDER — FENTANYL CITRATE (PF) 100 MCG/2ML IJ SOLN
INTRAMUSCULAR | Status: DC | PRN
Start: 2022-03-21 — End: 2022-03-21
  Administered 2022-03-21 (×3): 25 ug via INTRAVENOUS

## 2022-03-21 SURGICAL SUPPLY — 21 items
BALL SAPPHIRE NC24 3.75X12 (BALLOONS) ×2
BALLN SAPPHIRE 2.5X12 (BALLOONS) ×2
BALLOON SAPPHIRE 2.5X12 (BALLOONS) IMPLANT
BALLOON SAPPHIRE NC24 3.75X12 (BALLOONS) IMPLANT
CATH 5FR JL3.5 JR4 ANG PIG MP (CATHETERS) ×1 IMPLANT
CATH LAUNCHER 5F JL3 (CATHETERS) IMPLANT
CATHETER LAUNCHER 5F JL3 (CATHETERS) ×2
DEVICE RAD COMP TR BAND LRG (VASCULAR PRODUCTS) ×1 IMPLANT
GLIDESHEATH SLEND SS 6F .021 (SHEATH) ×1 IMPLANT
GUIDEWIRE INQWIRE 1.5J.035X260 (WIRE) IMPLANT
INQWIRE 1.5J .035X260CM (WIRE) ×2
KIT HEART LEFT (KITS) ×2 IMPLANT
PACK CARDIAC CATHETERIZATION (CUSTOM PROCEDURE TRAY) ×2 IMPLANT
SHEATH PROBE COVER 6X72 (BAG) ×1 IMPLANT
STENT SYNERGY XD 3.50X12 (Permanent Stent) IMPLANT
STENT SYNERGY XD 3.50X20 (Permanent Stent) IMPLANT
SYNERGY XD 3.50X12 (Permanent Stent) ×2 IMPLANT
SYNERGY XD 3.50X20 (Permanent Stent) ×2 IMPLANT
TRANSDUCER W/STOPCOCK (MISCELLANEOUS) ×2 IMPLANT
VALVE COPILOT STAT (MISCELLANEOUS) ×1 IMPLANT
WIRE COUGAR XT STRL 190CM (WIRE) ×1 IMPLANT

## 2022-03-21 NOTE — Progress Notes (Signed)
Ref: Patient, No Pcp Per (Inactive) ? ? ?Subjective:  ?Hypoglycemic without insulin use.  ?Blood sugars were dropping so had ice-cream and evening meal.. ?Will hold on starting oral hypoglycemic agents for now. ?Patient agrees to decrease calorie intake by 25 %. ?Mild tenderness and puffiness of right forearm without evidence of blood clot. ? ? ?Objective:  ?Vital Signs in the last 24 hours: ?Temp:  [98.1 ?F (36.7 ?C)-98.2 ?F (36.8 ?C)] 98.2 ?F (36.8 ?C) (04/21 1204) ?Pulse Rate:  [65-100] 78 (04/21 1500) ?Cardiac Rhythm: Normal sinus rhythm (04/21 1204) ?Resp:  [11-24] 18 (04/21 1204) ?BP: (97-148)/(43-108) 148/87 (04/21 1715) ?SpO2:  [89 %-100 %] 96 % (04/21 1500) ?Weight:  [95.3 kg] 95.3 kg (04/20 2346) ? ?Physical Exam: ?BP Readings from Last 1 Encounters:  ?03/21/22 (!) 148/87  ?   ?Wt Readings from Last 1 Encounters:  ?03/20/22 95.3 kg  ?  Weight change:  Body mass index is 32.89 kg/m?. ?HEENT: [place]/AT, Eyes-Blue, Conjunctiva-Pink, Sclera-Non-icteric ?Neck: No JVD, No bruit, Trachea midline. ?Lungs:  Clear, Bilateral. ?Cardiac:  Regular rhythm, normal S1 and S2, no S3. II/VI systolic murmur. ?Abdomen:  Soft, non-tender. BS present. ?Extremities:  No edema present. No cyanosis. No clubbing. Mild right arm tenderness. ?CNS: AxOx3, Cranial nerves grossly intact, moves all 4 extremities.  ?Skin: Warm and dry. ? ? ?Intake/Output from previous day: ?No intake/output data recorded. ? ? ? ?Lab Results: ?BMET ?   ?Component Value Date/Time  ? NA 140 03/21/2022 0302  ? NA 137 03/20/2022 1745  ? NA 139 08/15/2019 1720  ? K 3.8 03/21/2022 0302  ? K 4.0 03/20/2022 1745  ? K 3.9 08/15/2019 1720  ? CL 108 03/21/2022 0302  ? CL 106 03/20/2022 1745  ? CL 104 08/15/2019 1720  ? CO2 25 03/21/2022 0302  ? CO2 25 03/20/2022 1745  ? CO2 25 08/15/2019 1720  ? GLUCOSE 64 (L) 03/21/2022 0302  ? GLUCOSE 296 (H) 03/20/2022 1745  ? GLUCOSE 144 (H) 08/15/2019 1720  ? BUN 7 03/21/2022 0302  ? BUN 9 03/20/2022 1745  ? BUN 14 08/15/2019 1720   ? CREATININE 0.99 03/21/2022 0302  ? CREATININE 1.08 03/20/2022 1745  ? CREATININE 1.14 08/15/2019 1720  ? CALCIUM 8.7 (L) 03/21/2022 0302  ? CALCIUM 8.8 (L) 03/20/2022 1745  ? CALCIUM 9.1 08/15/2019 1720  ? GFRNONAA >60 03/21/2022 0302  ? GFRNONAA >60 03/20/2022 1745  ? GFRNONAA >60 08/15/2019 1720  ? GFRNONAA >60 08/14/2018 0520  ? GFRNONAA >60 08/12/2018 2054  ? GFRAA >60 08/15/2019 1720  ? GFRAA >60 08/14/2018 0520  ? GFRAA >60 08/12/2018 2054  ? ?CBC ?   ?Component Value Date/Time  ? WBC 6.8 03/21/2022 0302  ? RBC 5.28 03/21/2022 0302  ? HGB 16.5 03/21/2022 0302  ? HCT 48.5 03/21/2022 0302  ? PLT 250 03/21/2022 0302  ? MCV 91.9 03/21/2022 0302  ? MCH 31.3 03/21/2022 0302  ? MCHC 34.0 03/21/2022 0302  ? RDW 12.8 03/21/2022 0302  ? LYMPHSABS 1.0 08/12/2018 2054  ? MONOABS 0.5 08/12/2018 2054  ? EOSABS 0.1 08/12/2018 2054  ? BASOSABS 0.0 08/12/2018 2054  ? ?HEPATIC Function Panel ?No results for input(s): PROT in the last 8760 hours. ? ?Invalid input(s):  ALBUMIN,  AST,  ALT,  ALKPHOS,  BILIDIR,  IBILI ?HEMOGLOBIN A1C ?No components found for: HGA1C,  MPG ?CARDIAC ENZYMES ?No results found for: CKTOTAL, CKMB, CKMBINDEX, TROPONINI ?BNP ?No results for input(s): PROBNP in the last 8760 hours. ?TSH ?No results for input(s): TSH in the  last 8760 hours. ?CHOLESTEROL ?Recent Labs  ?  03/21/22 ?0302  ?CHOL 238*  ? ? ?Scheduled Meds: ? ALPRAZolam  0.5 mg Oral QHS  ? aspirin EC  81 mg Oral Daily  ? atorvastatin  80 mg Oral Daily  ? insulin aspart  0-20 Units Subcutaneous TID WC  ? insulin aspart  4 Units Subcutaneous TID WC  ? lisinopril  5 mg Oral Daily  ? metoprolol succinate  25 mg Oral Daily  ? sodium chloride flush  3 mL Intravenous Q12H  ? sodium chloride flush  3 mL Intravenous Q12H  ? ticagrelor  90 mg Oral BID  ? ?Continuous Infusions: ? sodium chloride    ? sodium chloride    ? sodium chloride    ? ?PRN Meds:.sodium chloride, sodium chloride, acetaminophen, nitroGLYCERIN, ondansetron (ZOFRAN) IV, sodium chloride  flush, sodium chloride flush ? ?Assessment/Plan: ? NSTEMI, initial episode ?S/P LAD stenting ?Type 2 DM ?Right forearm tenderness ? ?Plan: ?May keep right forearm elevated. ?May use ice-pack 5-10 minutes 3 times daily. ?Analgesic as needed. ?Check blood sugars 2-3 times a day and bring record to decide oral hypoglycemic agent in 1 week. ?Discussed low fat low cholesterol diet, CO-Q-10 use if needed. ?Home in AM if stable. F/U 1 week. ? ? LOS: 1 day  ? ?Time spent including chart review, lab review, examination, discussion with patient/Nurse : 30 min ? ? ?Orpah Cobb  MD  ?03/21/2022, 6:30 PM ? ? ? ? ?

## 2022-03-21 NOTE — ED Notes (Signed)
Pt ambulated to restroom without assistance.

## 2022-03-21 NOTE — Progress Notes (Signed)
ANTICOAGULATION CONSULT NOTE ?Pharmacy Consult for heparin ?Indication: chest pain/ACS ?Brief A/P: Heparin level subtherapeutic Increase Heparin rate ? ?No Known Allergies ? ?Patient Measurements: ?Weight obtained per patient report: 95.9 kg  ?Heparin Dosing Weight: 70 kg  ? ?Vital Signs: ?BP: 107/54 (04/21 0315) ?Pulse Rate: 68 (04/21 0315) ? ?Labs: ?Recent Labs  ?  03/20/22 ?1745 03/20/22 ?2004 03/20/22 ?2130 03/21/22 ?0302 03/21/22 ?[id number]  ?HGB 16.3  --   --  16.5  --   ?HCT 47.6  --   --  48.5  --   ?PLT 268  --   --  250  --   ?APTT  --   --  24  --   --   ?LABPROT  --   --  12.8  --   --   ?INR  --   --  1.0  --   --   ?HEPARINUNFRC  --   --   --   --  <0.10*  ?CREATININE 1.08  --   --  0.99  --   ?TROPONINIHS 119* 572*  --   --   --   ? ? ? ?Estimated Creatinine Clearance: 108.1 mL/min (by C-G formula based on SCr of 0.99 mg/dL). ? ?Assessment: ?41 y.o. male with chest pain for heparin  ? ?Goal of Therapy:  ?Heparin level 0.3-0.7 units/ml ?Monitor platelets by anticoagulation protocol: Yes ?  ?Plan:  ?Heparin 2000 units IV bolus, then increase heparin 1150 units/hr ?Follow up after cath today ? ?Phillis Knack, PharmD, BCPS ? ? ? ?

## 2022-03-21 NOTE — Care Management (Addendum)
?  Transition of Care (TOC) Screening Note ? ? ?Patient Details  ?Name: Bryan Adams ?Date of Birth: 1981/03/01 ? ? ?Transition of Care (TOC) CM/SW Contact:    ?Graves-Bigelow, Lamar Laundry, RN ?Phone Number: ?03/21/2022, 3:06 PM ? ? ? ?Transition of Care Department [facility]) has reviewed the patient. PTA patient was from home with spouse. Case Manager called the [facility] to make them aware of hospitalization. [facility] Auth # is [id number] and Notification ID: [id number]. Spouse states the patient has a PCP at the [facility]. Case Manager called the [facility] Fees Coordinator to get additional information about PCP and CSW-awaiting return phone call. Case Manager has provided the patients spouse with the Brilinta co pay card. If the patient discharges over the weekend, patient can pick up Brilinta at  [facility] [address] (in-stock). Refills to be e-scribed to [facility]. Discharge summary needs to be faxed to the [facility] as well. No further needs from Case Manager at this time.  ? ?1502 03-21-22 Case Manager received a call from the [facility] [employer]. The patient is an Armed forces training and education officer. CSW from Wed-Sat is [employer]okid[employer]-Mott [phone] ext [phone] and Tiffani Hargrove ext [phone]. Case Manager did try to call for a fax number-Voicemail left.  ? ?03-21-22 1607 Case Manager received call from [employer] CSW- clinicals can be faxed to the AOD at [phone]. ?

## 2022-03-21 NOTE — Interval H&P Note (Signed)
History and Physical Interval Note: ? ?03/21/2022 ?9:39 AM ? ?Bryan Adams  has presented today for surgery, with the diagnosis of NSTEMI.  The various methods of treatment have been discussed with the patient and family. After consideration of risks, benefits and other options for treatment, the patient has consented to  Procedure(s): ?LEFT HEART CATH AND CORONARY ANGIOGRAPHY (N/A) as a surgical intervention.  The patient's history has been reviewed, patient examined, no change in status, stable for surgery.  I have reviewed the patient's chart and labs.  Questions were answered to the patient's satisfaction.   ? ? ?[name] ? ? ?

## 2022-03-22 LAB — BASIC METABOLIC PANEL
Anion gap: 4 — ABNORMAL LOW (ref 5–15)
BUN: 7 mg/dL (ref 6–20)
CO2: 25 mmol/L (ref 22–32)
Calcium: 8.3 mg/dL — ABNORMAL LOW (ref 8.9–10.3)
Chloride: 109 mmol/L (ref 98–111)
Creatinine, Ser: 1.04 mg/dL (ref 0.61–1.24)
GFR, Estimated: >60 mL/min (ref >60)
Glucose, Bld: 142 mg/dL — ABNORMAL HIGH (ref 70–99)
Potassium: 3.8 mmol/L (ref 3.5–5.1)
Sodium: 138 mmol/L (ref 135–145)

## 2022-03-22 LAB — CBC
HCT: 45.1 % (ref 39.0–52.0)
Hemoglobin: 15.3 g/dL (ref 13.0–17.0)
MCH: 31.2 pg (ref 26.0–34.0)
MCHC: 33.9 g/dL (ref 30.0–36.0)
MCV: 91.9 fL (ref 80.0–100.0)
Platelets: 235 10*3/uL (ref 150–400)
RBC: 4.91 MIL/uL (ref 4.22–5.81)
RDW: 12.9 % (ref 11.5–15.5)
WBC: 6.2 10*3/uL (ref 4.0–10.5)
nRBC: 0 % (ref 0.0–0.2)

## 2022-03-22 LAB — GLUCOSE, CAPILLARY
Glucose-Capillary: 78 mg/dL (ref 70–99)
Glucose-Capillary: 76 mg/dL (ref 70–99)

## 2022-03-22 MED ORDER — METOPROLOL SUCCINATE ER 25 MG PO TB24: 25.0000 mg | ORAL_TABLET | Freq: Every day | ORAL | 3 refills | Status: AC

## 2022-03-22 MED ORDER — NITROGLYCERIN 0.4 MG SL SUBL
0.4000 mg | SUBLINGUAL_TABLET | SUBLINGUAL | 1 refills | Status: AC | PRN
Start: 2022-03-22 — End: ?

## 2022-03-22 MED ORDER — LISINOPRIL 5 MG PO TABS: 5.0000 mg | ORAL_TABLET | Freq: Every day | ORAL | 3 refills | Status: AC

## 2022-03-22 MED ORDER — ATORVASTATIN CALCIUM 80 MG PO TABS: 80.0000 mg | ORAL_TABLET | Freq: Every day | ORAL | 3 refills | Status: AC

## 2022-03-22 MED ORDER — TICAGRELOR 90 MG PO TABS: 90.0000 mg | ORAL_TABLET | Freq: Two times a day (BID) | ORAL | 11 refills | Status: AC

## 2022-03-22 MED ORDER — ASPIRIN 81 MG PO TBEC: 81.0000 mg | DELAYED_RELEASE_TABLET | Freq: Every day | ORAL | 11 refills | Status: AC

## 2022-03-22 MED ORDER — LIVING WELL WITH DIABETES BOOK
Freq: Once | Status: AC
  Filled 2022-03-22: qty 1

## 2022-03-22 NOTE — Progress Notes (Signed)
CARDIAC REHAB PHASE I  ? ?PRE:  Rate/Rhythm: 74 SR ? ?  BP: sitting 153/82 ? ?  SaO2: 97 RA ? ?MODE:  Ambulation: 900 ft  ? ?POST:  Rate/Rhythm: 72 SR ? ?  BP: sitting 162/96  ? ?  SaO2: 98 RA ? ?6440-3474 ?Patient ambulated in hallway independently. Steady gain. No cardiac complaints including chest pain or pressure. Post ambulation back to room. Sent card given. New DM dx. Education completed including DATP, review of MI booklet, NTG, exercise, diet, and phase 2 cardiac rehab. Referral placed to [place]. DC home today.   ? ?Tina Griffiths RN, BSN ? ?

## 2022-03-22 NOTE — TOC Transition Note (Signed)
Transition of Care (TOC) - CM/SW Discharge Note ? ? ?Patient Details  ?Name: Bryan Adams ?MRN: [id number] ?Date of Birth: 1981/06/21 ? ?Transition of Care (TOC) CM/SW Contact:  ?Kallie Locks, RN ?Phone Number: [phone] ?03/22/2022, 12:47 PM ? ? ?Clinical Narrative:  Prescriptions were sent to [facility] on [address]. Spoke with Delice Bison PharmD at [facility] to give information off of Brilinta 30 day free card. Discharge summary was dictated. Unable to view. Therefore unable to fax dc summary to [facility] at this time. Will send most recent progress note and H&P and AVS to VA at fax number [phone]. Made both patient and spouse aware of all of above information. ? ? ? ?Final next level of care: Home/Self Care ?Barriers to Discharge: No Barriers Identified ? ? ?Patient Goals and CMS Choice ?Patient states their goals for this hospitalization and ongoing recovery are:: return home ?  ?  ? ?Discharge Placement ?  ?           ?  ?  ?  ?  ? ?Discharge Plan and Services ?  ?  ?           ?  ?  ?  ?  ?  ?  ?  ?  ?  ?  ? ?Social Determinants of Health (SDOH) Interventions ?  ? ? ?Readmission Risk Interventions ?   ? View : No data to display.  ?  ?  ?  ? ? ? ? ? ?

## 2022-03-22 NOTE — Progress Notes (Signed)
Referral received.  Attempted to call patient but no answer.  He has previous hx. Of DM.  Needs to fu with [facility] for addition of medications for DM?  Ordered LWWD booklet for patient as well.   A1C is 7.1% which is at goal.   ? ?Thanks,  ?Beryl Meager, RN, BC-ADM ?Inpatient Diabetes Coordinator ?Pager [phone]  (8a-5p) ?

## 2022-03-22 NOTE — Discharge Summary (Signed)
NAME: Bryan Adams, LANGENBACH A. ?MEDICAL RECORD NO: [id number] ?ACCOUNT NO: [id number] ?DATE OF BIRTH: 1981-10-14 ?FACILITY: [facility] ?LOCATION: [facility] ?PHYSICIAN: Eduardo Osier. Sharyn Lull, MD ? ?Discharge Summary  ? ?DATE OF DISCHARGE: 03/22/2022 ? ?DATE OF ADMISSION:  03/20/2022. ? ?DATE OF DISCHARGE:  03/22/2022. ? ?ADMISSION DIAGNOSES: ?1.  NSTEMI. ?2.  Type 2 diabetes mellitus. ? ?DISCHARGE DIAGNOSES:    ?1.  Status post NSTEMI, status post left cardiac catheterization/PTCA stenting to proximal LAD. ?2.  Hypertension. ?3.  Type 2 diabetes mellitus, controlled by diet. ?4.  Hyperlipidemia.  ? ?DISCHARGE HOME MEDICATIONS:    ?1.  Enteric-coated aspirin 81 mg 1 tablet daily. ?2.  Atorvastatin 80 mg 1 tablet daily. ?3.  Lisinopril 5 mg 1 tablet daily. ?4.  Metoprolol succinate 25 mg daily. ?5.  Nitrostat 0.4 mg sublingual every 5 minutes, use as directed. ?6.  Brilinta 90 mg 1 tablet twice daily. ? ?DIET:  Low salt, low cholesterol, 1800 calories ADA diet. ? ?DISCHARGE INSTRUCTIONS:  ?The patient has been advised to monitor blood pressure and blood sugar regularly. ? ?The patient has been advised to avoid carbohydrates. ? ?Follow up with Dr. Algie Coffer in one week. ? ?The patient will be scheduled for phase 2 cardiac rehab as outpatient. ? ?CONDITION AT DISCHARGE:  Stable. ? ?BRIEF HISTORY:  The patient is a 41 year old male with past medical history significant for untreated type 2 diabetes mellitus and migraine headache, had 1-day history of left sided dull aching chest pain radiating to left arm with shortness of breath.   ?He had similar episode 2 weeks ago, but did not last as long.  In the ER, he was started on IV nitro and heparin and his pain was down to 2/10.  His troponin has upward trend with initial level of 119 with repeat level of 572.  EKG showed normal sinus  ?rhythm.  Chest x-ray, left lower lobe atelectasis.  CT angio was negative for PE. ? ?PHYSICAL EXAMINATION: ?GENERAL: On examination, his blood pressure was 130/108,  pulse 77.  He was afebrile. ?HEENT:  Conjunctivae was pink. ?NECK:  Supple, no JVD, no bruit. ?LUNGS:  Clear to auscultation bilaterally. ?CARDIOVASCULAR:  S1, S2 was normal.  There was II/VI systolic murmur.  No click, rub or gallop. ?ABDOMEN:  Soft.  Bowel sounds are present. ?EXTREMITIES:  There is no clubbing, cyanosis or edema. ?NEUROLOGIC:  Grossly intact. ? ?PERTINENT LABORATORY DATA:  His sodium was 137, potassium 4.0, glucose was 296.  Repeat blood sugar was 134.  His first set of troponin I was 119, second set was 572.  Hemoglobin was 16.3, hematocrit 47.6, white count of 7.0.  His hemoglobin A1c was 7.1. ?  Cholesterol was 238, HDL was low at 28, LDL was 193, triglycerides were 83.   ? ?BRIEF HOSPITAL COURSE: The patient was admitted by Dr. Algie Coffer.  The patient ruled in for small non-Q-wave infarction due to typical anginal chest pain and mildly elevated high sensitivity troponin I.  The patient subsequently underwent cardiac  ?catheterization, followed by PTCA stenting to proximal LAD by Dr. Tonny Bollman.  The patient tolerated the procedure well.  Post-procedure, the patient did not have any anginal chest pain.  The patient has been ambulating in the room and with his  ?cardiac rehab without any anginal pain.  The patient is eager to go home.  His right wrist catheterization site looks good with no evidence of hematoma.  The patient will be discharged home on above medications and will be followed by Dr. Algie Coffer  in one  ?week as outpatient.  The patient has been given instructions post-cardiac catheterization/PTCA stenting.  The patient will be scheduled for phase 2 cardiac rehab as outpatient. ? ? ?MUK ?D: 03/22/2022 11:12:55 am T: 03/22/2022 9:30:00 pm  ?JOB: [id number]/ [id number]  ?

## 2022-03-22 NOTE — Progress Notes (Signed)
Pt safely discharged. Discharge packet provided with teach-back method. VS wnL and as per flow. IVs removed, Pt verbalized understanding. All questions and concerns addressed. Case manager in to resolve pharmacy discrepancy. Wife present for transport. ?

## 2022-03-22 NOTE — Discharge Summary (Signed)
Discharge summary dictated on 03/22/2022 dictation number is [id number] ?

## 2022-03-24 ENCOUNTER — Encounter (HOSPITAL_COMMUNITY): Payer: Self-pay | Admitting: Cardiovascular Disease

## 2022-04-04 ENCOUNTER — Telehealth (HOSPITAL_COMMUNITY): Payer: Self-pay

## 2022-04-04 NOTE — Telephone Encounter (Signed)
Pt is unsure right now if he wants to participate in the cardiac rehab program right now. I advised pt that he has up to a year from his event to participate. I also advised pt that he has to call the [place] to send an authorization over to [place]. Pt stated that he would call back at a later date. ?Closed referral. ?

## 2023-05-22 ENCOUNTER — Emergency Department (HOSPITAL_COMMUNITY)
Admission: EM | Admit: 2023-05-22 | Discharge: 2023-05-22 | Disposition: A | Discharge status: Home / Self Care | Payer: No Typology Code available for payment source | Attending: Emergency Medicine | Admitting: Emergency Medicine

## 2023-05-22 ENCOUNTER — Emergency Department (HOSPITAL_COMMUNITY): Payer: No Typology Code available for payment source

## 2023-05-22 DIAGNOSIS — T7840XA Allergy, unspecified, initial encounter: Secondary | ICD-10-CM

## 2023-05-22 DIAGNOSIS — R079 Chest pain, unspecified: Secondary | ICD-10-CM | POA: Insufficient documentation

## 2023-05-22 DIAGNOSIS — Z7982 Long term (current) use of aspirin: Secondary | ICD-10-CM | POA: Insufficient documentation

## 2023-05-22 DIAGNOSIS — I2510 Atherosclerotic heart disease of native coronary artery without angina pectoris: Secondary | ICD-10-CM | POA: Diagnosis not present

## 2023-05-22 DIAGNOSIS — L500 Allergic urticaria: Secondary | ICD-10-CM | POA: Diagnosis not present

## 2023-05-22 LAB — CBC
HCT: 48.6 % (ref 39.0–52.0)
Hemoglobin: 16.2 g/dL (ref 13.0–17.0)
MCH: 30.6 pg (ref 26.0–34.0)
MCHC: 33.3 g/dL (ref 30.0–36.0)
MCV: 91.7 fL (ref 80.0–100.0)
Platelets: 195 10*3/uL (ref 150–400)
RBC: 5.3 MIL/uL (ref 4.22–5.81)
RDW: 11.8 % (ref 11.5–15.5)
WBC: 8 10*3/uL (ref 4.0–10.5)
nRBC: 0 % (ref 0.0–0.2)

## 2023-05-22 LAB — BASIC METABOLIC PANEL
Anion gap: 10 (ref 5–15)
BUN: 14 mg/dL (ref 6–20)
CO2: 23 mmol/L (ref 22–32)
Calcium: 8.5 mg/dL — ABNORMAL LOW (ref 8.9–10.3)
Chloride: 106 mmol/L (ref 98–111)
Creatinine, Ser: 1.13 mg/dL (ref 0.61–1.24)
GFR, Estimated: >60 mL/min (ref >60)
Glucose, Bld: 98 mg/dL (ref 70–99)
Potassium: 3.4 mmol/L — ABNORMAL LOW (ref 3.5–5.1)
Sodium: 139 mmol/L (ref 135–145)

## 2023-05-22 LAB — TROPONIN I (HIGH SENSITIVITY)
Troponin I (High Sensitivity): 12 ng/L (ref <18)
Troponin I (High Sensitivity): 11 ng/L (ref <18)

## 2023-05-22 MED ORDER — EPINEPHRINE 0.3 MG/0.3ML IJ SOAJ
INTRAMUSCULAR | Status: AC
  Filled 2023-05-22: qty 0.3

## 2023-05-22 MED ORDER — EPINEPHRINE 0.3 MG/0.3ML IJ SOAJ: 0.3000 mg | INTRAMUSCULAR | 0 refills | Status: AC | PRN

## 2023-05-22 MED ORDER — FAMOTIDINE IN NACL 20-0.9 MG/50ML-% IV SOLN
20.0000 mg | Freq: Once | INTRAVENOUS | Status: AC
  Administered 2023-05-22: 20 mg via INTRAVENOUS
  Filled 2023-05-22: qty 50

## 2023-05-22 MED ORDER — ASPIRIN 81 MG PO CHEW
324.0000 mg | CHEWABLE_TABLET | ORAL | Status: AC
  Administered 2023-05-22: 324 mg via ORAL
  Filled 2023-05-22: qty 4

## 2023-05-22 MED ORDER — METHYLPREDNISOLONE SODIUM SUCC 125 MG IJ SOLR
125.0000 mg | Freq: Once | INTRAMUSCULAR | Status: AC
  Administered 2023-05-22: 125 mg via INTRAVENOUS
  Filled 2023-05-22: qty 2

## 2023-05-22 NOTE — ED Provider Notes (Signed)
[place] EMERGENCY DEPARTMENT AT [facility] Provider Note   CSN: [id number] Arrival date & time: 05/22/23  1935     History  Chief Complaint  Patient presents with   Allergic Reaction    Bryan Adams is a 42 y.o. male.  42 year old male with a history of CAD status post PCI on metoprolol and insect allergy presents to the emergency department with allergic reaction.  At approximate 7 PM patient reports that he was working on his boat when he was getting bit by mosquitoes and then started to feel like he is having an allergic reaction.  Says he felt some swelling in his face and started having some difficulty breathing and nausea.  Also noticed hives on his body.  Got 50 of Benadryl from his wife and reports that his symptoms have started to improve.  No vomiting or syncope.  Had a similar event happened in the past where he had to have an EpiPen for an insect bite.  No other known exposures to allergies.       Home Medications Prior to Admission medications   Medication Sig Start Date End Date Taking? Authorizing Provider  EPINEPHrine 0.3 mg/0.3 mL IJ SOAJ injection Inject 0.3 mg into the muscle as needed for anaphylaxis. 05/22/23  Yes Rondel Baton, MD  aspirin EC 81 MG EC tablet Take 1 tablet (81 mg total) by mouth daily. Swallow whole. 03/23/22   Rinaldo Cloud, MD  atorvastatin (LIPITOR) 80 MG tablet Take 1 tablet (80 mg total) by mouth daily. 03/23/22   Rinaldo Cloud, MD  lisinopril (ZESTRIL) 5 MG tablet Take 1 tablet (5 mg total) by mouth daily. 03/23/22   Rinaldo Cloud, MD  metoprolol succinate (TOPROL-XL) 25 MG 24 hr tablet Take 1 tablet (25 mg total) by mouth daily. 03/23/22   Rinaldo Cloud, MD  nitroGLYCERIN (NITROSTAT) 0.4 MG SL tablet Place 1 tablet (0.4 mg total) under the tongue every 5 (five) minutes x 3 doses as needed for chest pain. 03/22/22   Rinaldo Cloud, MD  ticagrelor (BRILINTA) 90 MG TABS tablet Take 1 tablet (90 mg total) by mouth 2  (two) times daily. 03/22/22   Rinaldo Cloud, MD      Allergies    Patient has no known allergies.    Review of Systems   Review of Systems  Physical Exam Updated Vital Signs BP 132/73   Pulse 91   Temp 98.5 F (36.9 C)   Resp 14   SpO2 97%  Physical Exam Vitals and nursing note reviewed.  Constitutional:      General: He is not in acute distress.    Appearance: He is well-developed.     Comments: Speaking in full sentences in no acute distress  HENT:     Head: Normocephalic and atraumatic.     Right Ear: External ear normal.     Left Ear: External ear normal.     Nose: Nose normal.     Mouth/Throat:     Mouth: Mucous membranes are moist.     Pharynx: Oropharynx is clear. No oropharyngeal exudate or posterior oropharyngeal erythema.     Comments: No tongue or uvular swelling noted Eyes:     Extraocular Movements: Extraocular movements intact.     Conjunctiva/sclera: Conjunctivae normal.     Pupils: Pupils are equal, round, and reactive to light.  Cardiovascular:     Rate and Rhythm: Normal rate and regular rhythm.     Heart sounds: Normal heart sounds.  Pulmonary:  Effort: Pulmonary effort is normal. No respiratory distress.     Breath sounds: Normal breath sounds. No wheezing.  Abdominal:     General: There is no distension.     Palpations: Abdomen is soft. There is no mass.     Tenderness: There is no abdominal tenderness. There is no guarding.  Musculoskeletal:     Cervical back: Normal range of motion and neck supple.     Right lower leg: No edema.     Left lower leg: No edema.  Skin:    General: Skin is warm and dry.     Findings: Rash (Scattered urticarial rash on back and chest and abdomen) present.  Neurological:     Mental Status: He is alert. Mental status is at baseline.  Psychiatric:        Mood and Affect: Mood normal.        Behavior: Behavior normal.     ED Results / Procedures / Treatments   Labs (all labs ordered are listed, but only  abnormal results are displayed) Labs Reviewed  BASIC METABOLIC PANEL - Abnormal; Notable for the following components:      Result Value   Potassium 3.4 (*)    Calcium 8.5 (*)    All other components within normal limits  CBC  TROPONIN I (HIGH SENSITIVITY)  TROPONIN I (HIGH SENSITIVITY)    EKG EKG Interpretation  Date/Time:  Friday May 22 2023 20:07:15 EDT Ventricular Rate:  81 PR Interval:  184 QRS Duration: 96 QT Interval:  361 QTC Calculation: 419 R Axis:   75 Text Interpretation: Sinus rhythm Probable left ventricular hypertrophy ST elev, probable normal early repol pattern Confirmed by Vonita Moss [phone]) on 05/22/2023 8:11:42 PM  Radiology DG Chest Portable 1 View  Result Date: 05/22/2023 CLINICAL DATA:  Chest pain EXAM: PORTABLE CHEST 1 VIEW COMPARISON:  03/20/2022 FINDINGS: The heart size and mediastinal contours are within normal limits. Both lungs are clear. The visualized skeletal structures are unremarkable. IMPRESSION: No active disease. Electronically Signed   By: Ernie Avena M.D.   On: 05/22/2023 20:24    Procedures Procedures    Medications Ordered in ED Medications  EPINEPHrine (EPI-PEN) 0.3 mg/0.3 mL injection (  Not Given 05/22/23 2231)  methylPREDNISolone sodium succinate (SOLU-MEDROL) 125 mg/2 mL injection 125 mg (125 mg Intravenous Given 05/22/23 1957)  famotidine (PEPCID) IVPB 20 mg premix (0 mg Intravenous Stopped 05/22/23 2043)  aspirin chewable tablet 324 mg (324 mg Oral Given 05/22/23 2148)    ED Course/ Medical Decision Making/ A&P                             Medical Decision Making Amount and/or Complexity of Data Reviewed Labs: ordered. Radiology: ordered.  Risk OTC drugs. Prescription drug management.   Bryan Adams is a 42 y.o. male with comorbidities that complicate the patient evaluation including CAD status post PCI on metoprolol and insect allergy presents to the emergency department with allergic reaction.     Initial Ddx:  Allergic reaction, anaphylaxis, MI  MDM/Course:  Suspect patient having allergic reaction to insect bite.  On arrival is not having any wheezing and said that he did have some shortness of breath initially that had improved significantly after being given Benadryl by his wife prior to arrival.  Does have a rash still so we will go ahead and give him Solu-Medrol and Pepcid.  Shortly after arrival did start developing some chest discomfort  so was given aspirin and had an EKG and troponins that were sent off which were unremarkable x 2.  Said that his chest pain had resolved.  Did offer the patient admission for his chest pain after his observation time had ended for his allergic reaction but he stated that he preferred to go home and follow-up with his outpatient doctor and cardiologist.  Patient was sent home with an EpiPen prescription.  Return precautions discussed prior to discharge.  This patient presents to the ED for concern of complaints listed in HPI, this involves an extensive number of treatment options, and is a complaint that carries with it a high risk of complications and morbidity. Disposition including potential need for admission considered.   Dispo: DC Home. Return precautions discussed including, but not limited to, those listed in the AVS. Allowed pt time to ask questions which were answered fully prior to dc.  Additional history obtained from spouse Records reviewed Outpatient Clinic Notes The following labs were independently interpreted: Serial Troponins and show no acute abnormality I independently reviewed the following imaging with scope of interpretation limited to determining acute life threatening conditions related to emergency care: Chest x-ray and agree with the radiologist interpretation with the following exceptions: none I personally reviewed and interpreted cardiac monitoring: normal sinus rhythm  I personally reviewed and interpreted the pt's EKG:  see above for interpretation  I have reviewed the patients home medications and made adjustments as needed        Final Clinical Impression(s) / ED Diagnoses Final diagnoses:  Allergic reaction, initial encounter  Chest pain, unspecified type    Rx / DC Orders ED Discharge Orders          Ordered    EPINEPHrine 0.3 mg/0.3 mL IJ SOAJ injection  As needed        05/22/23 2315              Rondel Baton, MD 05/22/23 [phone]

## 2023-05-22 NOTE — ED Notes (Signed)
Pt c/o mid chest pain sharp and burning and right jaw pain.  States started suddenly.  EKG performed, MD notified, labs ordered.

## 2023-05-22 NOTE — Discharge Instructions (Addendum)
You were seen for your allergic reaction and chest discomfort in the emergency department.   At home, please use the EpiPen that we have prescribed you if you start developing an allergic reaction with shortness of breath, severe dizziness, or any other concerning symptoms.    Follow-up with your primary doctor in 2-3 days regarding your visit.  Please talk to the [place] about following up with a cardiologist regarding your chest discomfort and an allergist as well.  Return immediately to the emergency department if you experience any concerning symptoms.    Thank you for visiting our Emergency Department. It was a pleasure taking care of you today.

## 2023-05-22 NOTE — ED Triage Notes (Signed)
Patient in today reporting allergic reaction. Reports that he got stung "by something". Cough, tongue tingling, and inflammation. Breathing controlled.

## 2023-07-31 IMAGING — CT CT ANGIO CHEST
2 of 6 series · 18 of 46 positions shown · IV contrast (agent unspecified)
Comparison: Chest radiograph dated 03/20/2022.

CLINICAL DATA: Concern for pulmonary embolism.

EXAM:
CT ANGIOGRAPHY CHEST WITH CONTRAST
TECHNIQUE: Multidetector CT imaging of the chest was performed using the
standard protocol during bolus administration of intravenous
contrast. Multiplanar CT image reconstructions and MIPs were
obtained to evaluate the vascular anatomy.

[Series 6: thins · axial · 0.86mm/px · z∈[+1217,+1525]mm · 15 of 338 slices shown]
[im 15/338  lung]
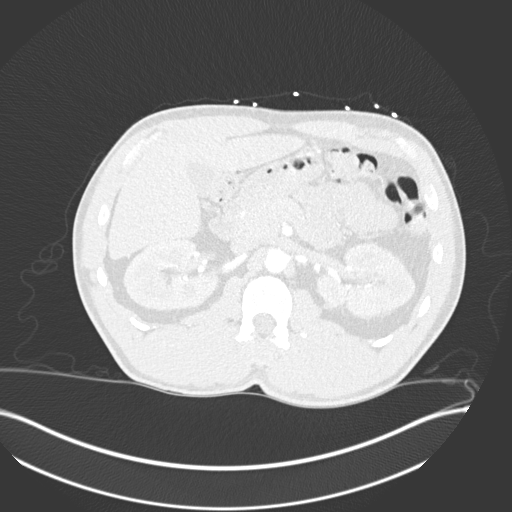
[im 44/338  soft-tissue]
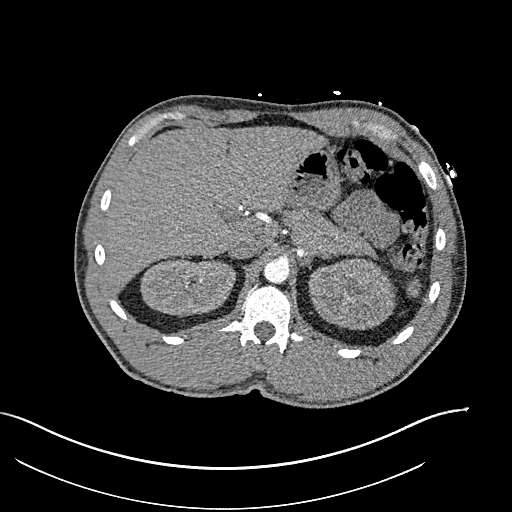
[im 59/338  lung]
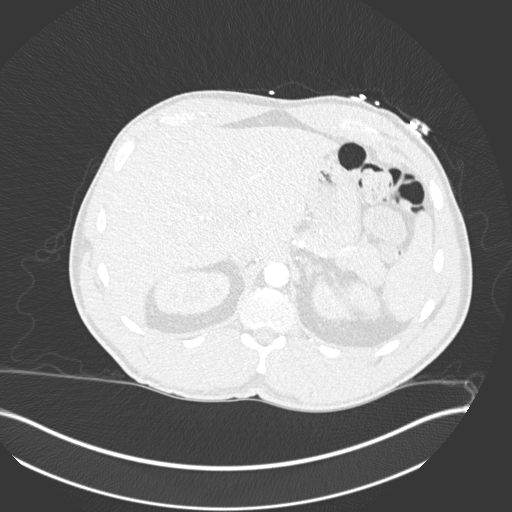
[im 88/338  soft-tissue]
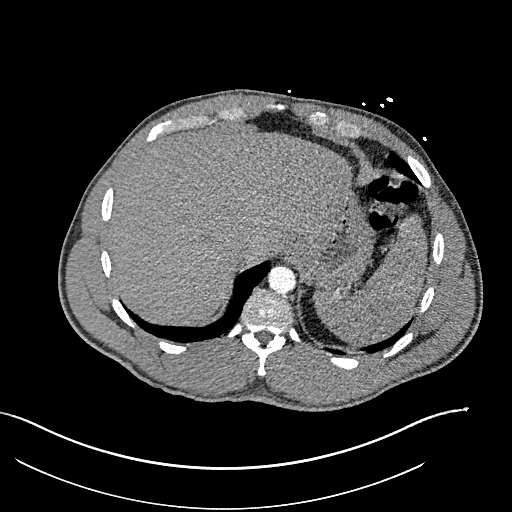
[im 103/338  lung]
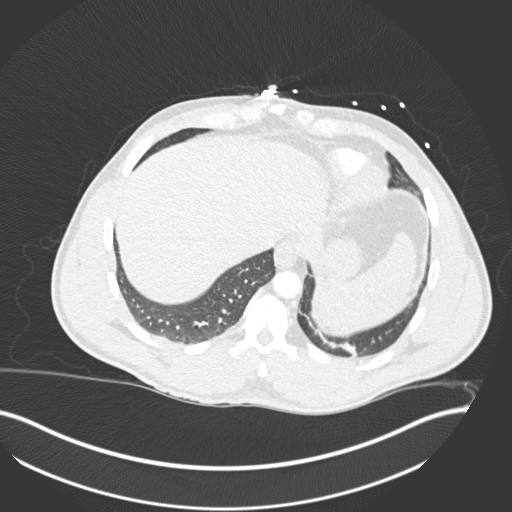
[im 132/338  soft-tissue]
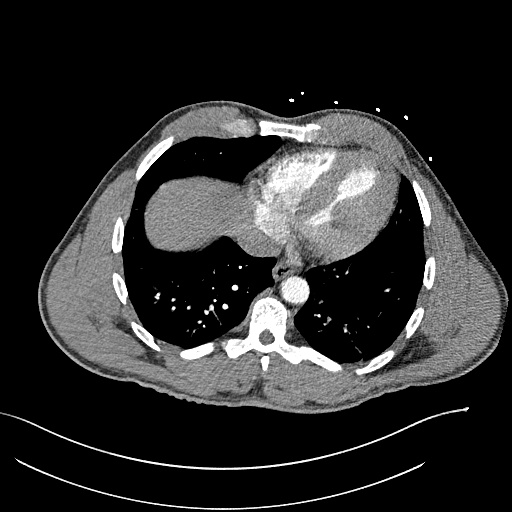
[im 147/338  lung]
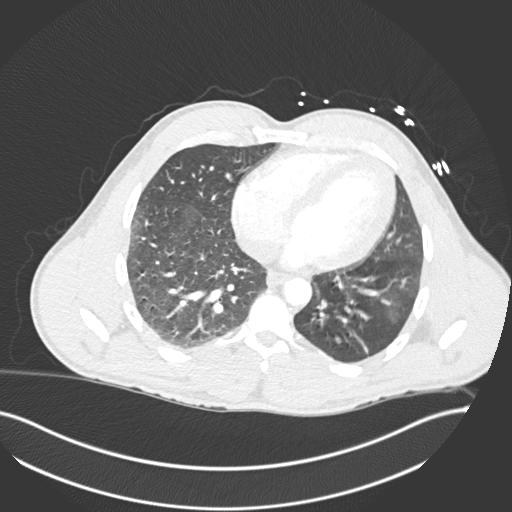
[im 176/338  soft-tissue]
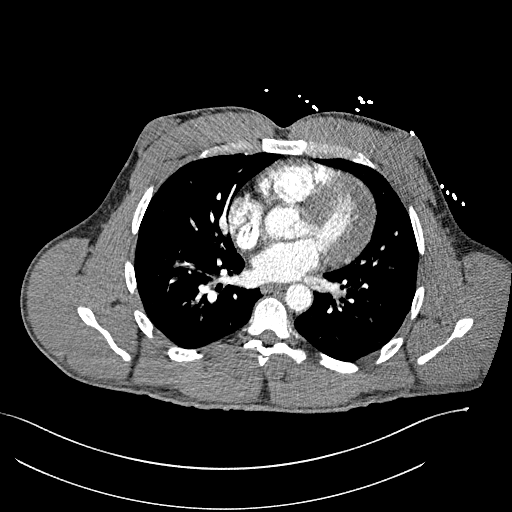
[im 191/338  lung]
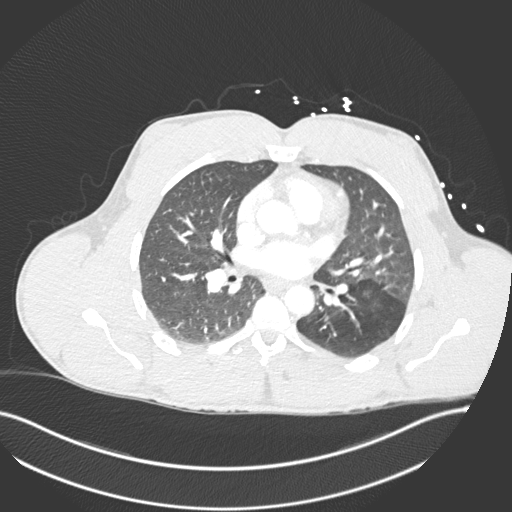
[im 206/338  soft-tissue]
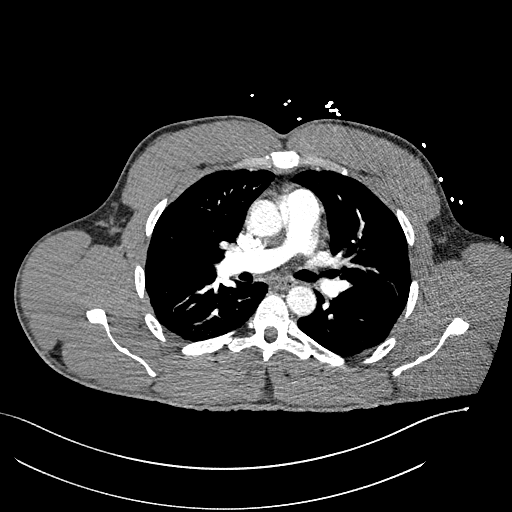
[im 235/338  lung]
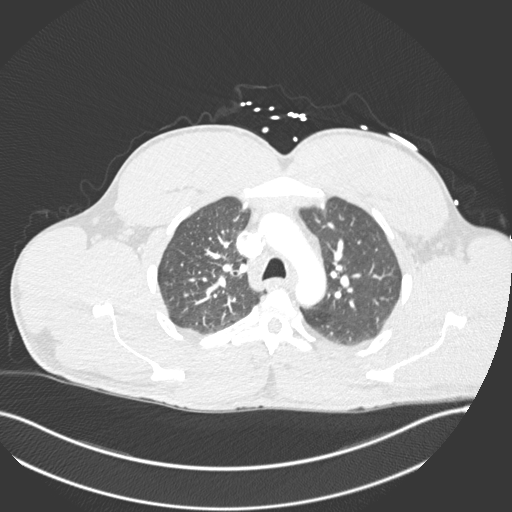
[im 250/338  soft-tissue]
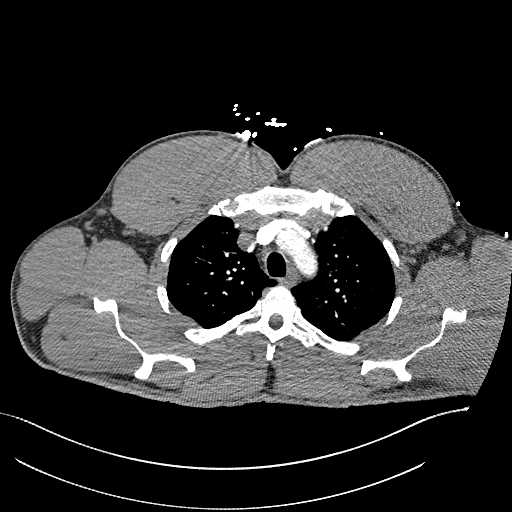
[im 279/338  lung]
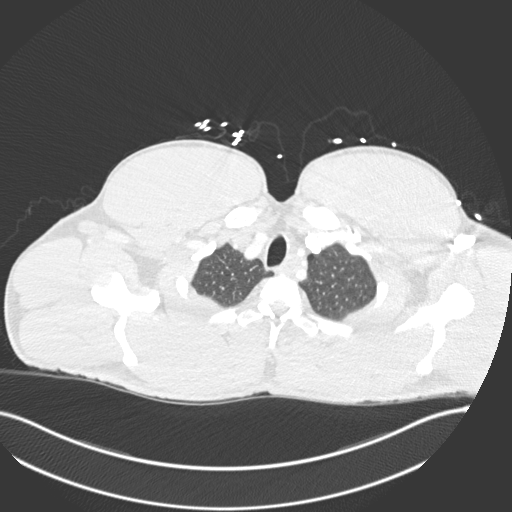
[im 294/338  soft-tissue]
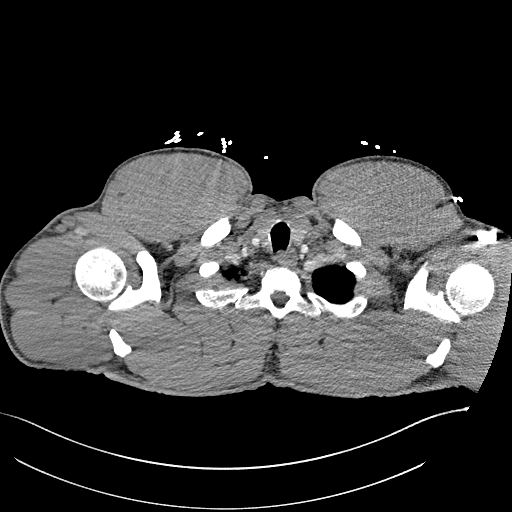
[im 323/338  lung]
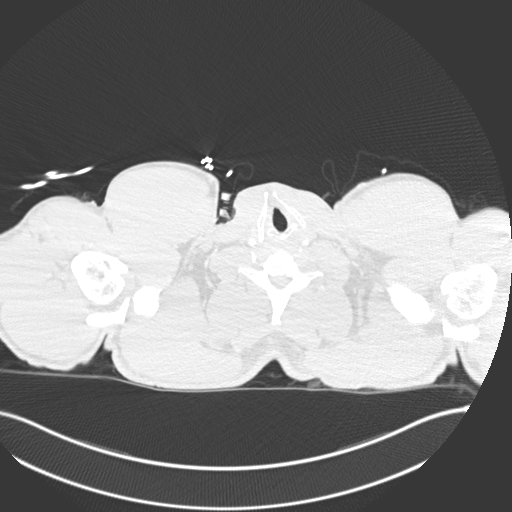

[Series 8: coronal mpr · coronal · 0.72mm/px · 3 of 151 slices shown]
[im 38/151  soft-tissue]
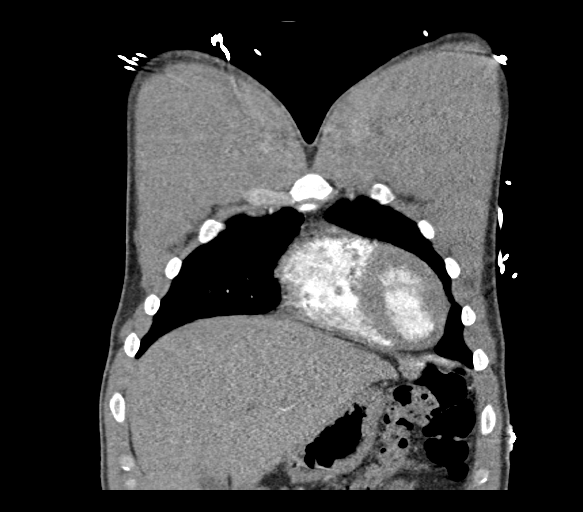
[im 76/151  soft-tissue]
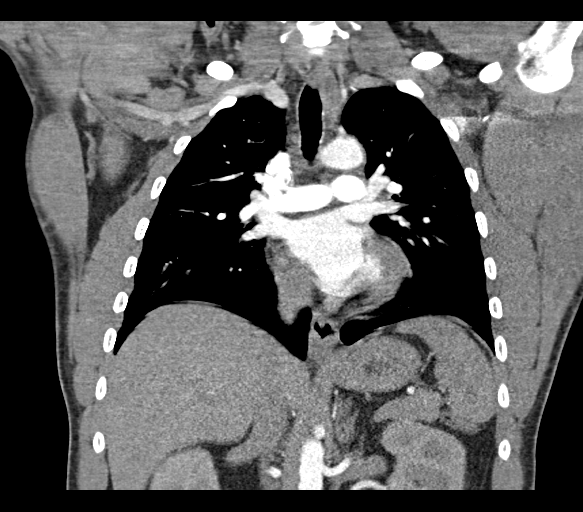
[im 113/151  soft-tissue]
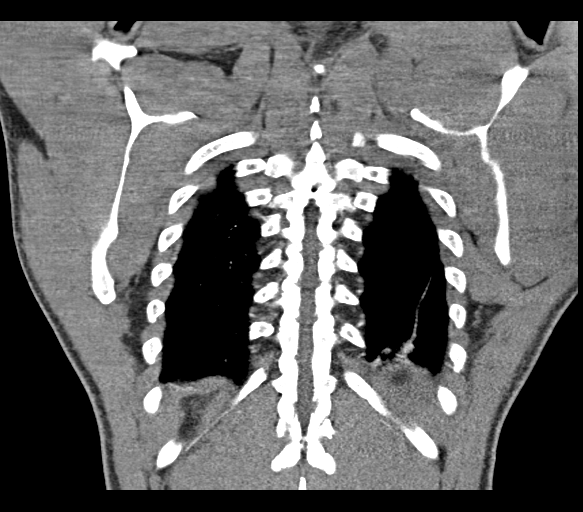

[18 of 46 positions shown; findings below may reference images not displayed]

RADIATION DOSE REDUCTION: This exam was performed according to the
departmental dose-optimization program which includes automated
exposure control, adjustment of the mA and/or kV according to
patient size and/or use of iterative reconstruction technique.

CONTRAST:  65mL OMNIPAQUE IOHEXOL 350 MG/ML SOLN
FINDINGS: Cardiovascular: There is no cardiomegaly or pericardial effusion.
The thoracic aorta is unremarkable. The origins of the great vessels
of the aortic arch appear patent. Evaluation of the pulmonary
arteries is limited due to respiratory motion artifact. No central
pulmonary artery embolus identified.

Mediastinum/Nodes: No hilar or mediastinal adenopathy. The esophagus
is grossly unremarkable. No mediastinal fluid collection.

Lungs/Pleura: Left lung base linear atelectasis/scarring. No focal
consolidation, pleural effusion, or pneumothorax. The central
airways are patent.

Upper Abdomen: No acute abnormality.

Musculoskeletal: No acute osseous pathology. Bilateral gynecomastia.

Review of the MIP images confirms the above findings.
IMPRESSION: No acute intrathoracic pathology. No CT evidence of central
pulmonary artery embolus.
